# Patient Record
Sex: Female | Born: 1970 | Race: White | Hispanic: No | Marital: Single | Smoking: Never smoker
Health system: Southern US, Community
[De-identification: ages and names within clinical notes are randomized; demographics above are authoritative.]

## PROBLEM LIST (undated history)

## (undated) DIAGNOSIS — N200 Calculus of kidney: Secondary | ICD-10-CM

## (undated) DIAGNOSIS — I10 Essential (primary) hypertension: Secondary | ICD-10-CM

## (undated) DIAGNOSIS — E039 Hypothyroidism, unspecified: Secondary | ICD-10-CM

## (undated) DIAGNOSIS — E079 Disorder of thyroid, unspecified: Secondary | ICD-10-CM

## (undated) DIAGNOSIS — F99 Mental disorder, not otherwise specified: Secondary | ICD-10-CM

## (undated) DIAGNOSIS — D649 Anemia, unspecified: Secondary | ICD-10-CM

## (undated) DIAGNOSIS — E559 Vitamin D deficiency, unspecified: Secondary | ICD-10-CM

## (undated) DIAGNOSIS — Z87442 Personal history of urinary calculi: Secondary | ICD-10-CM

## (undated) DIAGNOSIS — G43909 Migraine, unspecified, not intractable, without status migrainosus: Secondary | ICD-10-CM

## (undated) DIAGNOSIS — F419 Anxiety disorder, unspecified: Secondary | ICD-10-CM

## (undated) DIAGNOSIS — M1990 Unspecified osteoarthritis, unspecified site: Secondary | ICD-10-CM

## (undated) HISTORY — DX: Migraine, unspecified, not intractable, without status migrainosus: G43.909

## (undated) HISTORY — DX: Anxiety disorder, unspecified: F41.9

## (undated) HISTORY — PX: CHOLECYSTECTOMY: SHX55

## (undated) HISTORY — DX: Calculus of kidney: N20.0

## (undated) HISTORY — DX: Vitamin D deficiency, unspecified: E55.9

## (undated) HISTORY — PX: ABDOMINAL HYSTERECTOMY: SHX81

## (undated) HISTORY — DX: Mental disorder, not otherwise specified: F99

---

## 1995-09-16 HISTORY — PX: TUBAL LIGATION: SHX77

## 2017-09-15 HISTORY — PX: LAPAROSCOPIC HYSTERECTOMY: SHX1926

## 2021-01-25 ENCOUNTER — Other Ambulatory Visit: Payer: Self-pay | Admitting: Family Medicine

## 2021-01-25 ENCOUNTER — Other Ambulatory Visit (HOSPITAL_COMMUNITY): Payer: Self-pay | Admitting: Family Medicine

## 2021-01-25 DIAGNOSIS — R1030 Lower abdominal pain, unspecified: Secondary | ICD-10-CM

## 2021-01-30 ENCOUNTER — Ambulatory Visit (HOSPITAL_COMMUNITY): Admission: RE | Admit: 2021-01-30 | Payer: Medicaid Other | Source: Ambulatory Visit

## 2021-01-30 ENCOUNTER — Other Ambulatory Visit: Payer: Self-pay

## 2021-01-30 ENCOUNTER — Encounter (HOSPITAL_COMMUNITY): Payer: Self-pay

## 2021-01-30 ENCOUNTER — Observation Stay (HOSPITAL_COMMUNITY)
Admission: EM | Admit: 2021-01-30 | Discharge: 2021-01-31 | Disposition: A | Discharge status: Home / Self Care | Payer: Medicaid Other | Attending: Family Medicine | Admitting: Family Medicine

## 2021-01-30 ENCOUNTER — Emergency Department (HOSPITAL_COMMUNITY): Payer: Medicaid Other

## 2021-01-30 DIAGNOSIS — N201 Calculus of ureter: Secondary | ICD-10-CM | POA: Diagnosis not present

## 2021-01-30 DIAGNOSIS — F32A Depression, unspecified: Secondary | ICD-10-CM | POA: Diagnosis present

## 2021-01-30 DIAGNOSIS — E785 Hyperlipidemia, unspecified: Secondary | ICD-10-CM | POA: Diagnosis present

## 2021-01-30 DIAGNOSIS — F419 Anxiety disorder, unspecified: Secondary | ICD-10-CM | POA: Diagnosis present

## 2021-01-30 DIAGNOSIS — Z87442 Personal history of urinary calculi: Secondary | ICD-10-CM | POA: Diagnosis not present

## 2021-01-30 DIAGNOSIS — Z90710 Acquired absence of both cervix and uterus: Secondary | ICD-10-CM | POA: Diagnosis not present

## 2021-01-30 DIAGNOSIS — Z9049 Acquired absence of other specified parts of digestive tract: Secondary | ICD-10-CM | POA: Diagnosis not present

## 2021-01-30 DIAGNOSIS — N200 Calculus of kidney: Secondary | ICD-10-CM | POA: Diagnosis not present

## 2021-01-30 DIAGNOSIS — Z8249 Family history of ischemic heart disease and other diseases of the circulatory system: Secondary | ICD-10-CM | POA: Diagnosis not present

## 2021-01-30 DIAGNOSIS — Z20822 Contact with and (suspected) exposure to covid-19: Secondary | ICD-10-CM | POA: Insufficient documentation

## 2021-01-30 DIAGNOSIS — Z6841 Body Mass Index (BMI) 40.0 and over, adult: Secondary | ICD-10-CM | POA: Diagnosis not present

## 2021-01-30 DIAGNOSIS — R1031 Right lower quadrant pain: Secondary | ICD-10-CM | POA: Diagnosis present

## 2021-01-30 DIAGNOSIS — N179 Acute kidney failure, unspecified: Secondary | ICD-10-CM | POA: Insufficient documentation

## 2021-01-30 DIAGNOSIS — Z79899 Other long term (current) drug therapy: Secondary | ICD-10-CM | POA: Diagnosis not present

## 2021-01-30 DIAGNOSIS — N132 Hydronephrosis with renal and ureteral calculous obstruction: Principal | ICD-10-CM | POA: Diagnosis present

## 2021-01-30 DIAGNOSIS — I10 Essential (primary) hypertension: Secondary | ICD-10-CM | POA: Insufficient documentation

## 2021-01-30 HISTORY — DX: Disorder of thyroid, unspecified: E07.9

## 2021-01-30 HISTORY — DX: Essential (primary) hypertension: I10

## 2021-01-30 LAB — URINALYSIS, ROUTINE W REFLEX MICROSCOPIC
Bilirubin Urine: NEGATIVE
Glucose, UA: NEGATIVE mg/dL
Ketones, ur: NEGATIVE mg/dL
Leukocytes,Ua: NEGATIVE
Nitrite: NEGATIVE
Protein, ur: 30 mg/dL — AB
Specific Gravity, Urine: 1.029 (ref 1.005–1.030)
pH: 5 (ref 5.0–8.0)

## 2021-01-30 LAB — COMPREHENSIVE METABOLIC PANEL
ALT: 20 U/L (ref 0–44)
AST: 17 U/L (ref 15–41)
Albumin: 4.1 g/dL (ref 3.5–5.0)
Alkaline Phosphatase: 87 U/L (ref 38–126)
Anion gap: 8 (ref 5–15)
BUN: 24 mg/dL — ABNORMAL HIGH (ref 6–20)
CO2: 27 mmol/L (ref 22–32)
Calcium: 9.4 mg/dL (ref 8.9–10.3)
Chloride: 103 mmol/L (ref 98–111)
Creatinine, Ser: 1.74 mg/dL — ABNORMAL HIGH (ref 0.44–1.00)
GFR, Estimated: 36 mL/min — ABNORMAL LOW (ref >60)
Glucose, Bld: 122 mg/dL — ABNORMAL HIGH (ref 70–99)
Potassium: 4 mmol/L (ref 3.5–5.1)
Sodium: 138 mmol/L (ref 135–145)
Total Bilirubin: 0.5 mg/dL (ref 0.3–1.2)
Total Protein: 7.3 g/dL (ref 6.5–8.1)

## 2021-01-30 LAB — RESP PANEL BY RT-PCR (FLU A&B, COVID) ARPGX2
Influenza A by PCR: NEGATIVE
Influenza B by PCR: NEGATIVE
SARS Coronavirus 2 by RT PCR: NEGATIVE

## 2021-01-30 LAB — CBC WITH DIFFERENTIAL/PLATELET
Abs Immature Granulocytes: 0.05 10*3/uL (ref 0.00–0.07)
Basophils Absolute: 0.1 10*3/uL (ref 0.0–0.1)
Basophils Relative: 1 %
Eosinophils Absolute: 0.1 10*3/uL (ref 0.0–0.5)
Eosinophils Relative: 1 %
HCT: 47.5 % — ABNORMAL HIGH (ref 36.0–46.0)
Hemoglobin: 15.3 g/dL — ABNORMAL HIGH (ref 12.0–15.0)
Immature Granulocytes: 1 %
Lymphocytes Relative: 12 %
Lymphs Abs: 1.1 10*3/uL (ref 0.7–4.0)
MCH: 32.2 pg (ref 26.0–34.0)
MCHC: 32.2 g/dL (ref 30.0–36.0)
MCV: 100 fL (ref 80.0–100.0)
Monocytes Absolute: 0.7 10*3/uL (ref 0.1–1.0)
Monocytes Relative: 7 %
Neutro Abs: 7.4 10*3/uL (ref 1.7–7.7)
Neutrophils Relative %: 78 %
Platelets: 161 10*3/uL (ref 150–400)
RBC: 4.75 MIL/uL (ref 3.87–5.11)
RDW: 13.2 % (ref 11.5–15.5)
WBC: 9.5 10*3/uL (ref 4.0–10.5)
nRBC: 0 % (ref 0.0–0.2)

## 2021-01-30 LAB — PREGNANCY, URINE: Preg Test, Ur: NEGATIVE

## 2021-01-30 LAB — LIPASE, BLOOD: Lipase: 40 U/L (ref 11–51)

## 2021-01-30 MED ORDER — AMLODIPINE BESYLATE 5 MG PO TABS
10.0000 mg | ORAL_TABLET | Freq: Every day | ORAL | Status: DC
  Administered 2021-01-31: 10 mg via ORAL
  Filled 2021-01-30: qty 2

## 2021-01-30 MED ORDER — ACETAMINOPHEN 325 MG PO TABS: 650.0000 mg | ORAL_TABLET | Freq: Four times a day (QID) | ORAL | Status: DC | PRN

## 2021-01-30 MED ORDER — ONDANSETRON HCL 4 MG PO TABS: 4.0000 mg | ORAL_TABLET | Freq: Four times a day (QID) | ORAL | Status: DC | PRN

## 2021-01-30 MED ORDER — SODIUM CHLORIDE 0.9 % IV BOLUS
1000.0000 mL | Freq: Once | INTRAVENOUS | Status: AC
  Administered 2021-01-30: 1000 mL via INTRAVENOUS

## 2021-01-30 MED ORDER — SODIUM CHLORIDE 0.9 % IV SOLN: INTRAVENOUS | Status: AC

## 2021-01-30 MED ORDER — SODIUM CHLORIDE 0.9 % IV SOLN: INTRAVENOUS | Status: DC

## 2021-01-30 MED ORDER — HYDROMORPHONE HCL 1 MG/ML IJ SOLN
1.0000 mg | Freq: Once | INTRAMUSCULAR | Status: AC
Start: 2021-01-30 — End: 2021-01-30
  Administered 2021-01-30: 1 mg via INTRAVENOUS
  Filled 2021-01-30: qty 1

## 2021-01-30 MED ORDER — ESCITALOPRAM OXALATE 10 MG PO TABS
20.0000 mg | ORAL_TABLET | Freq: Every day | ORAL | Status: DC
  Administered 2021-01-31: 20 mg via ORAL
  Filled 2021-01-30: qty 2

## 2021-01-30 MED ORDER — ONDANSETRON HCL 4 MG/2ML IJ SOLN
4.0000 mg | Freq: Once | INTRAMUSCULAR | Status: AC
  Administered 2021-01-30: 4 mg via INTRAVENOUS
  Filled 2021-01-30: qty 2

## 2021-01-30 MED ORDER — AMLODIPINE BESYLATE 5 MG PO TABS
5.0000 mg | ORAL_TABLET | Freq: Once | ORAL | Status: AC
  Administered 2021-01-30: 5 mg via ORAL
  Filled 2021-01-30: qty 1

## 2021-01-30 MED ORDER — ONDANSETRON HCL 4 MG/2ML IJ SOLN
4.0000 mg | Freq: Four times a day (QID) | INTRAMUSCULAR | Status: DC | PRN
  Administered 2021-01-31: 4 mg via INTRAVENOUS
  Filled 2021-01-30: qty 2

## 2021-01-30 MED ORDER — ACETAMINOPHEN 650 MG RE SUPP: 650.0000 mg | Freq: Four times a day (QID) | RECTAL | Status: DC | PRN

## 2021-01-30 MED ORDER — POLYETHYLENE GLYCOL 3350 17 G PO PACK: 17.0000 g | PACK | Freq: Every day | ORAL | Status: DC | PRN

## 2021-01-30 MED ORDER — MORPHINE SULFATE (PF) 4 MG/ML IV SOLN
4.0000 mg | INTRAVENOUS | Status: DC | PRN
Start: 2021-01-30 — End: 2021-02-01
  Administered 2021-01-30 – 2021-01-31 (×3): 4 mg via INTRAVENOUS
  Filled 2021-01-30 (×3): qty 1

## 2021-01-30 MED ORDER — LEVOTHYROXINE SODIUM 75 MCG PO TABS
175.0000 ug | ORAL_TABLET | Freq: Every day | ORAL | Status: DC
  Administered 2021-01-31: 175 ug via ORAL
  Filled 2021-01-30: qty 1

## 2021-01-30 MED ORDER — MORPHINE SULFATE (PF) 4 MG/ML IV SOLN
4.0000 mg | Freq: Once | INTRAVENOUS | Status: AC
Start: 2021-01-30 — End: 2021-01-30
  Administered 2021-01-30: 4 mg via INTRAVENOUS
  Filled 2021-01-30: qty 1

## 2021-01-30 MED ORDER — LOSARTAN POTASSIUM 50 MG PO TABS
100.0000 mg | ORAL_TABLET | Freq: Every day | ORAL | Status: DC
  Administered 2021-01-31: 100 mg via ORAL
  Filled 2021-01-30: qty 2

## 2021-01-30 NOTE — ED Triage Notes (Signed)
Pt presents to ED with complaints of intermittent lower abdominal pain x couple weeks. Pt states pain started in her back now is above her pubic bone, pt also with dysuria.

## 2021-01-30 NOTE — H&P (Signed)
History and Physical    Sue Schmidt TKW:[id number] DOB: 03-07-1971 DOA: 01/30/2021  PCP: Ludwig Clarks, FNP   Patient coming from: Home  I have personally briefly reviewed patient's old medical records in [place]  Chief Complaint: Back and lower abdominal pain  HPI: Sue Schmidt is a 49 y.o. female with medical history significant for hypertension, thyroid disease. Patient presented to the ED with complaints of lower abdominal pain for 2 to 3 weeks.  Pain is initially started as right back pain which progressed to lower abdominal pain.  She reports pain with urination, and reports she is unable to pass urine due to pain.  She reports 1 episode of vomiting here in the ED.  No fevers no chills.  Patient also had gross blood in her urine about 2 weeks ago. No personal history of kidney stones. Patient saw her primary care provider UA did not show signs of infection but she was placed on prophylactic antibiotics which she completed.  She does not know the name of the antibiotics.  ED Course: Blood pressure systolic 924Q to 683M, temperature 97.6.  WBC 9.5.  UA shows moderate hemoglobin rare bacteria 6-10 WBCs.  Renal stone study shows moderate right hydroureteronephrosis with 4 mm calculus at the ureterovesical junction. EDP talked with Dr. Alyson Ingles, plans for stents tomorrow.  Hospitalist to admit.  Review of Systems: As per HPI all other systems reviewed and negative.  Past Medical History:  Diagnosis Date  . Hypertension   . Thyroid disease     Past Surgical History:  Procedure Laterality Date  . ABDOMINAL HYSTERECTOMY    . CHOLECYSTECTOMY       reports that she has never smoked. She has never used smokeless tobacco. She reports previous alcohol use. She reports previous drug use. Drug: Marijuana.  No Known Allergies  Family history of hypertension in father.  Prior to Admission medications   Medication Sig Start Date End Date Taking? Authorizing Provider   amLODipine (NORVASC) 10 MG tablet Take 1 tablet by mouth daily. 01/10/21  Yes [provider]  atorvastatin (LIPITOR) 10 MG tablet Take 1 tablet by mouth daily. 01/24/21  Yes [provider]  Cholecalciferol (VITAMIN D3) 50 MCG (2000 UT) TABS Take 1 tablet by mouth daily. 10/24/20  Yes [provider]  escitalopram (LEXAPRO) 20 MG tablet Take 1 tablet by mouth daily. 10/10/20  Yes [provider]  hydrOXYzine (ATARAX/VISTARIL) 25 MG tablet Take 25 mg by mouth daily as needed. 10/10/20  Yes [provider]  levothyroxine (SYNTHROID) 175 MCG tablet Take 175 mcg by mouth daily. 01/11/21  Yes [provider]  losartan (COZAAR) 100 MG tablet Take 1 tablet by mouth daily. 01/10/21  Yes [provider]  SUMAtriptan (IMITREX) 100 MG tablet Take 100 mg by mouth as needed for headache. 10/10/20  Yes [provider]    Physical Exam: Vitals:   01/30/21 1405 01/30/21 1430 01/30/21 1500 01/30/21 1530  BP:  (!) 177/115 (!) 177/93 (!) 164/108  Pulse: 71 70    Resp: 16 (!) 23 (!) 24 17  Temp:      TempSrc:      SpO2: 97% 94% 95% 90%  Weight:      Height:        Constitutional: NAD, calm, comfortable Vitals:   01/30/21 1405 01/30/21 1430 01/30/21 1500 01/30/21 1530  BP:  (!) 177/115 (!) 177/93 (!) 164/108  Pulse: 71 70    Resp: 16 (!) 23 (!) 24 17  Temp:      TempSrc:      SpO2: 97% 94% 95% 90%  Weight:      Height:       Eyes: PERRL, lids and conjunctivae normal ENMT: Mucous membranes are moist. Posterior pharynx clear of any exudate or lesions.Normal dentition.  Neck: normal, supple, no masses, no thyromegaly Respiratory: clear to auscultation bilaterally, no wheezing, no crackles. Normal respiratory effort. No accessory muscle use.  Cardiovascular: Regular rate and rhythm, no murmurs / rubs / gallops. No extremity edema. 2+ pedal pulses. No carotid bruits.  Abdomen: Moderate right lower abdominal tenderness, abdomen soft, no  masses palpated. No hepatosplenomegaly. Bowel sounds positive.  Musculoskeletal: no clubbing / cyanosis. No joint deformity upper and lower extremities. Good ROM, no contractures. Normal muscle tone.  Skin: no rashes, lesions, ulcers. No induration Neurologic: No apparent cranial normality, moving extremities spontaneously.  Psychiatric: Normal judgment and insight. Alert and oriented x 3. Normal mood.   Labs on Admission: I have personally reviewed following labs and imaging studies  CBC: Recent Labs  Lab 01/30/21 1151  WBC 9.5  NEUTROABS 7.4  HGB 15.3*  HCT 47.5*  MCV 100.0  PLT 993   Basic Metabolic Panel: Recent Labs  Lab 01/30/21 1151  NA 138  K 4.0  CL 103  CO2 27  GLUCOSE 122*  BUN 24*  CREATININE 1.74*  CALCIUM 9.4   Liver Function Tests: Recent Labs  Lab 01/30/21 1151  AST 17  ALT 20  ALKPHOS 87  BILITOT 0.5  PROT 7.3  ALBUMIN 4.1   Recent Labs  Lab 01/30/21 1151  LIPASE 40   Urine analysis:    Component Value Date/Time   COLORURINE YELLOW 01/30/2021 1031   APPEARANCEUR TURBID (A) 01/30/2021 1031   LABSPEC 1.029 01/30/2021 1031   PHURINE 5.0 01/30/2021 1031   GLUCOSEU NEGATIVE 01/30/2021 1031   HGBUR MODERATE (A) 01/30/2021 [place] 01/30/2021 [place] 01/30/2021 1031   PROTEINUR 30 (A) 01/30/2021 1031   NITRITE NEGATIVE 01/30/2021 1031   [place] 01/30/2021 1031    Radiological Exams on Admission: CT Renal Stone Study  Result Date: 01/30/2021 CLINICAL DATA:  Acute right flank pain, dysuria. EXAM: CT ABDOMEN AND PELVIS WITHOUT CONTRAST TECHNIQUE: Multidetector CT imaging of the abdomen and pelvis was performed following the standard protocol without IV contrast. COMPARISON:  None. FINDINGS: Lower chest: No acute abnormality. Hepatobiliary: No focal liver abnormality is seen. Status post cholecystectomy. No biliary dilatation. Pancreas: Unremarkable. No pancreatic ductal dilatation or  surrounding inflammatory changes. Spleen: Normal in size without focal abnormality. Adrenals/Urinary Tract: Adrenal glands appear normal. Moderate right hydroureteronephrosis is noted with perinephric stranding secondary to 4 mm calculus at right ureterovesical junction. Left kidney and ureter are unremarkable. Urinary bladder is decompressed. Stomach/Bowel: Stomach is within normal limits. Appendix appears normal. No evidence of bowel wall thickening, distention, or inflammatory changes. Vascular/Lymphatic: No significant vascular findings are present. No enlarged abdominal or pelvic lymph nodes. Reproductive: Status post hysterectomy. No adnexal masses. Other: No abdominal wall hernia or abnormality. No abdominopelvic ascites. Musculoskeletal: No acute or significant osseous findings. IMPRESSION: Moderate right hydroureteronephrosis is noted with perinephric stranding secondary to 4 mm calculus at right ureterovesical junction. Electronically Signed   By: Marijo Conception M.D.   On: 01/30/2021 12:50    EKG: None.  Assessment/Plan Principal Problem:   Renal stone Active Problems:   AKI (acute kidney injury) ([place])   HTN (hypertension)   Dyslipidemia  Nephrolithiasis-stone study  shows moderate right hydroureteronephrosis with 4 mm calculus and ureteral vesicle junction.  UA with large hemoglobin, rare bacteria 6-10 WBCs.  Afebrile without leukocytosis. Recently completed a course of antibiotics- ?name. - EDP talked to urologist Dr. Alyson Ingles, plan for stent in a.m. -IV morphine 4 mg every 3 hours as needed for pain -2 L bolus given, continue N/s 100cc/hr x 15 hrs -N.p.o. midnight -Hold off on antibiotics for now -Follow-up urine cultures  AKI_creatinine 1.7, no baseline to compare -Hydrate  HTN-elevated. -Resume Norvasc, losartan.  Depression/Anxiety -Resume Lexapro  DVT prophylaxis: SCDS for now Code Status: Full code Family Communication: None at bedside Disposition Plan: ~ 1 -2  days Consults called: Urology Admission status: Inpt, tele I certify that at the point of admission it is my clinical judgment that the patient will require inpatient hospital care spanning beyond 2 midnights from the point of admission due to high intensity of service, high risk for further deterioration and high frequency of surveillance required.     Bethena Roys MD [facility]  01/30/2021, 8:06 PM

## 2021-01-30 NOTE — ED Provider Notes (Signed)
[facility] Provider Note   CSN: [id number] Arrival date & time: 01/30/21  1016     History Chief Complaint  Patient presents with  . Abdominal Pain    Sue Schmidt is a 50 y.o. female with  past medical history of hypertension that presents to the emergency department today for complaints of intermittent lower abdominal pain for the past couple of weeks.  Patient states that pain started in her right lower back and is now primarily in her right suprapubic region, has been worsening over the past couple of days.  Patient states that she had gross blood in her urine about 2 weeks ago, went to her PCP who did a urinalysis at that time she is not so signs of infection according to patient, however provider put her on prophylactic antibiotics which she completed.  Patient does not know the name of these.  Patient states that over the past night the pain worsened and is now primarily in the right suprapubic region, does not radiate anywhere, is sharp and intermittent.  States that she has not been taking anything for this, states that she has been hydrating well.  Also admits to dysuria, urinary frequency, however states that she is unable to urinate due to the pain.  Denies any back pain currently, fevers, myalgias, headache, URI symptoms, sick contacts.  States that she is having nausea and vomiting, vomiting up bile.  Denies any diarrhea, normal bowel movements.  States that she has not had an appetite.  Denies any history of kidney stones or kidney disease.  Denies any vaginal complaints.  Denies any chest pain or shortness of breath.  HPI     Past Medical History:  Diagnosis Date  . Hypertension   . Thyroid disease     There are no problems to display for this patient.   Past Surgical History:  Procedure Laterality Date  . ABDOMINAL HYSTERECTOMY    . CHOLECYSTECTOMY       OB History   No obstetric history on file.     No family history on file.  Social  History   Tobacco Use  . Smoking status: Never Smoker  . Smokeless tobacco: Never Used  Substance Use Topics  . Alcohol use: Not Currently  . Drug use: Not Currently    Types: Marijuana    Home Medications Prior to Admission medications   Medication Sig Start Date End Date Taking? Authorizing Provider  amLODipine (NORVASC) 10 MG tablet Take 1 tablet by mouth daily. 01/10/21  Yes [provider]  atorvastatin (LIPITOR) 10 MG tablet Take 1 tablet by mouth daily. 01/24/21  Yes [provider]  Cholecalciferol (VITAMIN D3) 50 MCG (2000 UT) TABS Take 1 tablet by mouth daily. 10/24/20  Yes [provider]  escitalopram (LEXAPRO) 20 MG tablet Take 1 tablet by mouth daily. 10/10/20  Yes [provider]  hydrOXYzine (ATARAX/VISTARIL) 25 MG tablet Take 25 mg by mouth daily as needed. 10/10/20  Yes [provider]  levothyroxine (SYNTHROID) 175 MCG tablet Take 175 mcg by mouth daily. 01/11/21  Yes [provider]  losartan (COZAAR) 100 MG tablet Take 1 tablet by mouth daily. 01/10/21  Yes [provider]  SUMAtriptan (IMITREX) 100 MG tablet Take 100 mg by mouth as needed for headache. 10/10/20  Yes [provider]    Allergies    Patient has no known allergies.  Review of Systems   Review of Systems  Constitutional: Negative for chills, diaphoresis, fatigue and  fever.  HENT: Negative for congestion, sore throat and trouble swallowing.   Eyes: Negative for pain and visual disturbance.  Respiratory: Negative for cough, shortness of breath and wheezing.   Cardiovascular: Negative for chest pain, palpitations and leg swelling.  Gastrointestinal: Positive for abdominal pain. Negative for abdominal distention, diarrhea, nausea and vomiting.  Genitourinary: Positive for decreased urine volume, dysuria, frequency, hematuria and urgency. Negative for difficulty urinating, vaginal bleeding, vaginal discharge and vaginal pain.   Musculoskeletal: Negative for back pain, neck pain and neck stiffness.  Skin: Negative for pallor.  Neurological: Negative for dizziness, speech difficulty, weakness and headaches.  Psychiatric/Behavioral: Negative for confusion.    Physical Exam Updated Vital Signs BP (!) 164/108   Pulse 70   Temp 97.6 F (36.4 C) (Oral)   Resp 17   Ht 5\' 5"  (1.651 m)   Wt 120.2 kg   SpO2 90%   BMI 44.10 kg/m   Physical Exam Constitutional:      General: She is not in acute distress.    Appearance: Normal appearance. She is not ill-appearing, toxic-appearing or diaphoretic.  HENT:     Mouth/Throat:     Mouth: Mucous membranes are moist.     Pharynx: Oropharynx is clear.  Eyes:     General: No scleral icterus.    Extraocular Movements: Extraocular movements intact.     Pupils: Pupils are equal, round, and reactive to light.  Cardiovascular:     Rate and Rhythm: Normal rate and regular rhythm.     Pulses: Normal pulses.     Heart sounds: Normal heart sounds.  Pulmonary:     Effort: Pulmonary effort is normal. No respiratory distress.     Breath sounds: Normal breath sounds. No stridor. No wheezing, rhonchi or rales.  Chest:     Chest wall: No tenderness.  Abdominal:     General: Abdomen is flat. There is no distension.     Palpations: Abdomen is soft.     Tenderness: There is abdominal tenderness in the suprapubic area. There is guarding. There is no right CVA tenderness, left CVA tenderness or rebound. Negative signs include Murphy's sign, psoas sign and obturator sign.       Comments: Patient with tenderness to suprapubic region and right side.  No true McBurney's point.  Guarding noted in this area.  No CVA tenderness.  Musculoskeletal:        General: No swelling or tenderness. Normal range of motion.     Cervical back: Normal range of motion and neck supple. No rigidity.     Right lower leg: No edema.     Left lower leg: No edema.  Skin:    General: Skin is warm and dry.      Capillary Refill: Capillary refill takes less than 2 seconds.     Coloration: Skin is not pale.  Neurological:     General: No focal deficit present.     Mental Status: She is alert and oriented to person, place, and time.  Psychiatric:        Mood and Affect: Mood normal.        Behavior: Behavior normal.     ED Results / Procedures / Treatments   Labs (all labs ordered are listed, but only abnormal results are displayed) Labs Reviewed  CBC WITH DIFFERENTIAL/PLATELET - Abnormal; Notable for the following components:      Result Value   Hemoglobin 15.3 (*)    HCT 47.5 (*)    All other components  within normal limits  COMPREHENSIVE METABOLIC PANEL - Abnormal; Notable for the following components:   Glucose, Bld 122 (*)    BUN 24 (*)    Creatinine, Ser 1.74 (*)    GFR, Estimated 36 (*)    All other components within normal limits  URINALYSIS, ROUTINE W REFLEX MICROSCOPIC - Abnormal; Notable for the following components:   APPearance TURBID (*)    Hgb urine dipstick MODERATE (*)    Protein, ur 30 (*)    Bacteria, UA RARE (*)    All other components within normal limits  RESP PANEL BY RT-PCR (FLU A&B, COVID) ARPGX2  LIPASE, BLOOD  PREGNANCY, URINE    EKG None  Radiology CT Renal Stone Study  Result Date: 01/30/2021 CLINICAL DATA:  Acute right flank pain, dysuria. EXAM: CT ABDOMEN AND PELVIS WITHOUT CONTRAST TECHNIQUE: Multidetector CT imaging of the abdomen and pelvis was performed following the standard protocol without IV contrast. COMPARISON:  None. FINDINGS: Lower chest: No acute abnormality. Hepatobiliary: No focal liver abnormality is seen. Status post cholecystectomy. No biliary dilatation. Pancreas: Unremarkable. No pancreatic ductal dilatation or surrounding inflammatory changes. Spleen: Normal in size without focal abnormality. Adrenals/Urinary Tract: Adrenal glands appear normal. Moderate right hydroureteronephrosis is noted with perinephric stranding secondary to 4  mm calculus at right ureterovesical junction. Left kidney and ureter are unremarkable. Urinary bladder is decompressed. Stomach/Bowel: Stomach is within normal limits. Appendix appears normal. No evidence of bowel wall thickening, distention, or inflammatory changes. Vascular/Lymphatic: No significant vascular findings are present. No enlarged abdominal or pelvic lymph nodes. Reproductive: Status post hysterectomy. No adnexal masses. Other: No abdominal wall hernia or abnormality. No abdominopelvic ascites. Musculoskeletal: No acute or significant osseous findings. IMPRESSION: Moderate right hydroureteronephrosis is noted with perinephric stranding secondary to 4 mm calculus at right ureterovesical junction. Electronically Signed   By: Marijo Conception M.D.   On: 01/30/2021 12:50    Procedures Procedures   Medications Ordered in ED Medications  sodium chloride 0.9 % bolus 1,000 mL (0 mLs Intravenous Stopped 01/30/21 1209)  ondansetron (ZOFRAN) injection 4 mg (4 mg Intravenous Given 01/30/21 1123)  HYDROmorphone (DILAUDID) injection 1 mg (1 mg Intravenous Given 01/30/21 1124)  amLODipine (NORVASC) tablet 5 mg (5 mg Oral Given 01/30/21 1209)  ondansetron (ZOFRAN) injection 4 mg (4 mg Intravenous Given 01/30/21 1405)  sodium chloride 0.9 % bolus 1,000 mL (1,000 mLs Intravenous New Bag/Given 01/30/21 1405)  morphine 4 MG/ML injection 4 mg (4 mg Intravenous Given 01/30/21 1406)    ED Course  I have reviewed the triage vital signs and the nursing notes.  Pertinent labs & imaging results that were available during my care of the patient were reviewed by me and considered in my medical decision making (see chart for details).    MDM Rules/Calculators/A&P                           Patient presents to the emerge department today for right suprapubic pain in addition to urinary complaints, differential to include renal colic, urethral stone.  Patient is significantly hypertensive with systolic 629, patient  states that she forgot to take her blood pressure medication this morning.  We will give that here, do think that this is also increased due to pain.  We will treat with IV fluids, Zofran and Dilaudid at this time, CT renal study ordered.  Low likelihood for abdominal dissection or AAA patient does not have any back pain at this time,  pain is intermittent and not sharp.  Pain is not midline and symptoms are most suggestive of renal colic.   Patient with 4 mm stone with hydroureteronephrosis, presumed UTI.  Unable to obtain other records from patient, patient admitted to [facility].  Creatinine here today 1.7 2:04 rounds of pain medication and IV fluids, patient states that her pain is still not under control, will admit at this time.  [facility], Dr. Alyson Ingles who will place stent tomorrow. Patient admitted to Dr. Denton Brick.  The patient appears reasonably stabilized for admission considering the current resources, flow, and capabilities available in the ED at this time, and I doubt any other [facility] requiring further screening and/or treatment in the ED prior to admission.   Final Clinical Impression(s) / ED Diagnoses Final diagnoses:  Nephrolithiasis    Rx / DC Orders ED Discharge Orders    None       Alfredia Client, PA-C 01/30/21 1632    Truddie Hidden, MD 01/30/21 [phone]

## 2021-01-31 ENCOUNTER — Inpatient Hospital Stay (HOSPITAL_COMMUNITY): Payer: Medicaid Other | Admitting: Certified Registered"

## 2021-01-31 ENCOUNTER — Inpatient Hospital Stay (HOSPITAL_COMMUNITY): Payer: Medicaid Other

## 2021-01-31 ENCOUNTER — Other Ambulatory Visit: Payer: Self-pay

## 2021-01-31 ENCOUNTER — Encounter (HOSPITAL_COMMUNITY): Payer: Self-pay | Admitting: Internal Medicine

## 2021-01-31 ENCOUNTER — Encounter (HOSPITAL_COMMUNITY)
Admission: EM | Disposition: A | Discharge status: Home / Self Care | Payer: Self-pay | Source: Home / Self Care | Attending: Emergency Medicine

## 2021-01-31 DIAGNOSIS — N200 Calculus of kidney: Secondary | ICD-10-CM | POA: Diagnosis not present

## 2021-01-31 DIAGNOSIS — N201 Calculus of ureter: Secondary | ICD-10-CM | POA: Diagnosis not present

## 2021-01-31 DIAGNOSIS — Z79899 Other long term (current) drug therapy: Secondary | ICD-10-CM | POA: Diagnosis not present

## 2021-01-31 DIAGNOSIS — I10 Essential (primary) hypertension: Secondary | ICD-10-CM | POA: Diagnosis not present

## 2021-01-31 DIAGNOSIS — Z20822 Contact with and (suspected) exposure to covid-19: Secondary | ICD-10-CM | POA: Diagnosis not present

## 2021-01-31 DIAGNOSIS — N1330 Unspecified hydronephrosis: Secondary | ICD-10-CM | POA: Diagnosis not present

## 2021-01-31 HISTORY — PX: STONE EXTRACTION WITH BASKET: SHX5318

## 2021-01-31 HISTORY — PX: CYSTOSCOPY WITH RETROGRADE PYELOGRAM, URETEROSCOPY AND STENT PLACEMENT: SHX5789

## 2021-01-31 LAB — CBC
HCT: 44 % (ref 36.0–46.0)
Hemoglobin: 13.9 g/dL (ref 12.0–15.0)
MCH: 32.3 pg (ref 26.0–34.0)
MCHC: 31.6 g/dL (ref 30.0–36.0)
MCV: 102.1 fL — ABNORMAL HIGH (ref 80.0–100.0)
Platelets: 142 10*3/uL — ABNORMAL LOW (ref 150–400)
RBC: 4.31 MIL/uL (ref 3.87–5.11)
RDW: 13.2 % (ref 11.5–15.5)
WBC: 8.2 10*3/uL (ref 4.0–10.5)
nRBC: 0 % (ref 0.0–0.2)

## 2021-01-31 LAB — BASIC METABOLIC PANEL
Anion gap: 7 (ref 5–15)
BUN: 17 mg/dL (ref 6–20)
CO2: 24 mmol/L (ref 22–32)
Calcium: 8 mg/dL — ABNORMAL LOW (ref 8.9–10.3)
Chloride: 106 mmol/L (ref 98–111)
Creatinine, Ser: 1.53 mg/dL — ABNORMAL HIGH (ref 0.44–1.00)
GFR, Estimated: 41 mL/min — ABNORMAL LOW (ref >60)
Glucose, Bld: 130 mg/dL — ABNORMAL HIGH (ref 70–99)
Potassium: 3.7 mmol/L (ref 3.5–5.1)
Sodium: 137 mmol/L (ref 135–145)

## 2021-01-31 LAB — HIV ANTIBODY (ROUTINE TESTING W REFLEX): HIV Screen 4th Generation wRfx: NONREACTIVE

## 2021-01-31 SURGERY — CYSTOURETEROSCOPY, WITH RETROGRADE PYELOGRAM AND STENT INSERTION
Anesthesia: General | Laterality: Right

## 2021-01-31 MED ORDER — CHLORHEXIDINE GLUCONATE 0.12 % MT SOLN
OROMUCOSAL | Status: AC
  Filled 2021-01-31: qty 15

## 2021-01-31 MED ORDER — FENTANYL CITRATE (PF) 100 MCG/2ML IJ SOLN
INTRAMUSCULAR | Status: AC
  Filled 2021-01-31: qty 2

## 2021-01-31 MED ORDER — LIDOCAINE HCL (CARDIAC) PF 100 MG/5ML IV SOSY
PREFILLED_SYRINGE | INTRAVENOUS | Status: DC | PRN
  Administered 2021-01-31: 60 mg via INTRAVENOUS

## 2021-01-31 MED ORDER — PHENYLEPHRINE HCL (PRESSORS) 10 MG/ML IV SOLN
INTRAVENOUS | Status: DC | PRN
  Administered 2021-01-31: 40 ug via INTRAVENOUS
  Administered 2021-01-31 (×3): 120 ug via INTRAVENOUS

## 2021-01-31 MED ORDER — ONDANSETRON HCL 4 MG PO TABS: 4.0000 mg | ORAL_TABLET | Freq: Every day | ORAL | 1 refills | Status: AC | PRN

## 2021-01-31 MED ORDER — LACTATED RINGERS IV SOLN: INTRAVENOUS | Status: DC

## 2021-01-31 MED ORDER — SODIUM CHLORIDE 0.9 % IR SOLN
Status: DC | PRN
  Administered 2021-01-31: 3000 mL via INTRAVESICAL

## 2021-01-31 MED ORDER — WATER FOR IRRIGATION, STERILE IR SOLN
Status: DC | PRN
  Administered 2021-01-31: 500 mL

## 2021-01-31 MED ORDER — ONDANSETRON HCL 4 MG/2ML IJ SOLN: 4.0000 mg | Freq: Once | INTRAMUSCULAR | Status: DC | PRN

## 2021-01-31 MED ORDER — FENTANYL CITRATE (PF) 100 MCG/2ML IJ SOLN: 25.0000 ug | INTRAMUSCULAR | Status: DC | PRN

## 2021-01-31 MED ORDER — ORAL CARE MOUTH RINSE: 15.0000 mL | Freq: Once | OROMUCOSAL | Status: AC

## 2021-01-31 MED ORDER — HYDRALAZINE HCL 100 MG PO TABS: 50.0000 mg | ORAL_TABLET | Freq: Two times a day (BID) | ORAL | 3 refills | Status: DC

## 2021-01-31 MED ORDER — PROPOFOL 10 MG/ML IV BOLUS
INTRAVENOUS | Status: DC | PRN
  Administered 2021-01-31: 250 mg via INTRAVENOUS

## 2021-01-31 MED ORDER — CEFAZOLIN SODIUM-DEXTROSE 2-4 GM/100ML-% IV SOLN
2.0000 g | Freq: Once | INTRAVENOUS | Status: AC
  Administered 2021-01-31: 2 g via INTRAVENOUS
  Filled 2021-01-31: qty 100

## 2021-01-31 MED ORDER — DIATRIZOATE MEGLUMINE 30 % UR SOLN
URETHRAL | Status: DC | PRN
  Administered 2021-01-31: 6 mL

## 2021-01-31 MED ORDER — OXYCODONE-ACETAMINOPHEN 5-325 MG PO TABS: 1.0000 | ORAL_TABLET | ORAL | 0 refills | Status: AC | PRN

## 2021-01-31 MED ORDER — FENTANYL CITRATE (PF) 250 MCG/5ML IJ SOLN
INTRAMUSCULAR | Status: DC | PRN
  Administered 2021-01-31 (×2): 25 ug via INTRAVENOUS

## 2021-01-31 MED ORDER — CHLORHEXIDINE GLUCONATE 0.12 % MT SOLN
15.0000 mL | Freq: Once | OROMUCOSAL | Status: AC
  Administered 2021-01-31: 15 mL via OROMUCOSAL

## 2021-01-31 MED ORDER — LABETALOL HCL 5 MG/ML IV SOLN
10.0000 mg | INTRAVENOUS | Status: DC | PRN
  Administered 2021-01-31: 10 mg via INTRAVENOUS
  Filled 2021-01-31: qty 4

## 2021-01-31 MED ORDER — DIATRIZOATE MEGLUMINE 30 % UR SOLN
URETHRAL | Status: AC
  Filled 2021-01-31: qty 100

## 2021-01-31 MED ORDER — MIDAZOLAM HCL 2 MG/2ML IJ SOLN
INTRAMUSCULAR | Status: AC
  Filled 2021-01-31: qty 2

## 2021-01-31 MED ORDER — ONDANSETRON HCL 4 MG/2ML IJ SOLN
INTRAMUSCULAR | Status: DC | PRN
  Administered 2021-01-31: 4 mg via INTRAVENOUS

## 2021-01-31 MED ORDER — MIDAZOLAM HCL 2 MG/2ML IJ SOLN
INTRAMUSCULAR | Status: DC | PRN
  Administered 2021-01-31: 2 mg via INTRAVENOUS

## 2021-01-31 MED ORDER — DEXAMETHASONE SODIUM PHOSPHATE 10 MG/ML IJ SOLN
INTRAMUSCULAR | Status: DC | PRN
  Administered 2021-01-31: 5 mg via INTRAVENOUS

## 2021-01-31 SURGICAL SUPPLY — 24 items
BAG DRAIN URO TABLE W/ADPT NS (BAG) ×2 IMPLANT
BAG HAMPER (MISCELLANEOUS) ×2 IMPLANT
CATH INTERMIT  6FR 70CM (CATHETERS) ×2 IMPLANT
CLOTH BEACON ORANGE TIMEOUT ST (SAFETY) ×2 IMPLANT
DECANTER SPIKE VIAL GLASS SM (MISCELLANEOUS) ×2 IMPLANT
EXTRACTOR STONE NITINOL NGAGE (UROLOGICAL SUPPLIES) ×2 IMPLANT
GLOVE BIO SURGEON STRL SZ8 (GLOVE) ×2 IMPLANT
GLOVE SURG LTX SZ6.5 (GLOVE) ×2 IMPLANT
GLOVE SURG UNDER POLY LF SZ7 (GLOVE) ×4 IMPLANT
GOWN STRL REUS W/TWL LRG LVL3 (GOWN DISPOSABLE) ×2 IMPLANT
GOWN STRL REUS W/TWL XL LVL3 (GOWN DISPOSABLE) ×2 IMPLANT
GUIDEWIRE STR DUAL SENSOR (WIRE) ×2 IMPLANT
GUIDEWIRE STR ZIPWIRE 035X150 (MISCELLANEOUS) ×2 IMPLANT
IV NS IRRIG 3000ML ARTHROMATIC (IV SOLUTION) ×4 IMPLANT
KIT TURNOVER CYSTO (KITS) ×2 IMPLANT
MANIFOLD NEPTUNE II (INSTRUMENTS) ×2 IMPLANT
PACK CYSTO (CUSTOM PROCEDURE TRAY) ×2 IMPLANT
PAD ARMBOARD 7.5X6 YLW CONV (MISCELLANEOUS) ×2 IMPLANT
STENT URET 6FRX26 CONTOUR (STENTS) ×2 IMPLANT
SYR 10ML LL (SYRINGE) ×2 IMPLANT
SYR CONTROL 10ML LL (SYRINGE) ×2 IMPLANT
TOWEL NATURAL 4PK STERILE (DISPOSABLE) ×2 IMPLANT
TOWEL OR 17X26 4PK STRL BLUE (TOWEL DISPOSABLE) ×2 IMPLANT
WATER STERILE IRR 500ML POUR (IV SOLUTION) ×2 IMPLANT

## 2021-01-31 NOTE — Op Note (Signed)
Preoperative diagnosis: Right ureteral stone  Postoperative diagnosis: Same  Procedure: 1 cystoscopy 2 right retrograde pyelography 3.  Intraoperative fluoroscopy, under one hour, with interpretation 4.  Right ureteroscopic stone manipulation with basket extraction 5.  Right 6 x 26 JJ stent placement  Attending: Rosie Fate  Anesthesia: General  Estimated blood loss: None  Drains: Right 6 x 26 JJ ureteral stent with tether  Specimens: stone for analysis  Antibiotics: ancef  Findings: right distal ureteral calculus. Moderate hydronephrosis.  Indications: Patient is a 50 year old female with a history of ureteral stone and who has failed medical expulsive therapy.  After discussing treatment options, she decided proceed with right ureteroscopic stone manipulation.  Procedure her in detail: The patient was brought to the operating room and a brief timeout was done to ensure correct patient, correct procedure, correct site.  General anesthesia was administered patient was placed in dorsal lithotomy position.  Her genitalia was then prepped and draped in usual sterile fashion.  A rigid 62 French cystoscope was passed in the urethra and the bladder.  Bladder was inspected free masses or lesions.  the right ureteral orifices were in the normal orthotopic locations.  a 6 french ureteral catheter was then instilled into the right ureter orifice.  a gentle retrograde was obtained and findings noted above.  we then placed a zip wire through the ureteral catheter and advanced up to the renal pelvis.  we then removed the cystoscope and cannulated the right ureteral orifice with a semirigid ureteroscope.  we then encountered the stone in the distal ureter which was removed with an NGage basket.  We then elected to place a ureteral stent after the stone was removed. We placed a 6 x 26 double-j ureteral stent over the original zip wire.   We then removed the wire and good coil was noted in the the  renal pelvis under fluoroscopy and the bladder under direct vision.     the bladder was then drained and this concluded the procedure which was well tolerated by patient.  Complications: None  Condition: Stable, extubated, transferred to PACU  Plan: Patient is to be discharged home as to follow-up in one week. She is to remove her stent in 72 hours by pulling the tether

## 2021-01-31 NOTE — Anesthesia Procedure Notes (Signed)
Procedure Name: LMA Insertion Date/Time: 01/31/2021 10:23 AM Performed by: Karna Dupes, CRNA Pre-anesthesia Checklist: Patient identified, Emergency Drugs available, Suction available and Patient being monitored Patient Re-evaluated:Patient Re-evaluated prior to induction Oxygen Delivery Method: Circle system utilized Preoxygenation: Pre-oxygenation with 100% oxygen Induction Type: IV induction LMA: LMA inserted LMA Size: 3.0 Number of attempts: 2 (LMA 4 tried small mouth opening. ) Tube secured with: Tape Dental Injury: Teeth and Oropharynx as per pre-operative assessment

## 2021-01-31 NOTE — Discharge Instructions (Signed)
Ureteral Stent Implantation, Care After This sheet gives you information about how to care for yourself after your procedure. Your health care provider may also give you more specific instructions. If you have problems or questions, contact your health care provider. What can I expect after the procedure? After the procedure, it is common to have:  Nausea.  Mild pain when you urinate. You may feel this pain in your lower back or lower abdomen. The pain should stop within a few minutes after you urinate. This may last for up to 1 week.  A small amount of blood in your urine for several days. Follow these instructions at home: Medicines  Take over-the-counter and prescription medicines only as told by your health care provider.  If you were prescribed an antibiotic medicine, take it as told by your health care provider. Do not stop taking the antibiotic even if you start to feel better.  Do not drive for 24 hours if you were given a sedative during your procedure.  Ask your health care provider if the medicine prescribed to you requires you to avoid driving or using heavy machinery. Activity  Rest as told by your health care provider.  Avoid sitting for a long time without moving. Get up to take short walks every 1-2 hours. This is important to improve blood flow and breathing. Ask for help if you feel weak or unsteady.  Return to your normal activities as told by your health care provider. Ask your health care provider what activities are safe for you. General instructions  Watch for any blood in your urine. Call your health care provider if the amount of blood in your urine increases.  If you have a catheter: ? Follow instructions from your health care provider about taking care of your catheter and collection bag. ? Do not take baths, swim, or use a hot tub until your health care provider approves. Ask your health care provider if you may take showers. You may only be allowed to  take sponge baths.  Drink enough fluid to keep your urine pale yellow.  Do not use any products that contain nicotine or tobacco, such as cigarettes, e-cigarettes, and chewing tobacco. These can delay healing after surgery. If you need help quitting, ask your health care provider.  Keep all follow-up visits as told by your health care provider. This is important.   Contact a health care provider if:  You have pain that gets worse or does not get better with medicine, especially pain when you urinate.  You have difficulty urinating.  You feel nauseous or you vomit repeatedly during a period of more than 2 days after the procedure. Get help right away if:  Your urine is dark red or has blood clots in it.  You are leaking urine (have incontinence).  The end of the stent comes out of your urethra.  You cannot urinate.  You have sudden, sharp, or severe pain in your abdomen or lower back.  You have a fever.  You have swelling or pain in your legs.  You have difficulty breathing. Summary  After the procedure, it is common to have mild pain when you urinate that goes away within a few minutes after you urinate. This may last for up to 1 week.  Watch for any blood in your urine. Call your health care provider if the amount of blood in your urine increases.  Take over-the-counter and prescription medicines only as told by your health care provider.  Drink enough fluid to keep your urine pale yellow. This information is not intended to replace advice given to you by your health care provider. Make sure you discuss any questions you have with your health care provider. Document Revised: 06/08/2018 Document Reviewed: 06/09/2018 Elsevier Patient Education  2021 [place] REMOVE YOUR STENT IN 72 HOURS BY GENTLY PULLING THE STRING-remove her stent in 72 hours by pulling the tether

## 2021-01-31 NOTE — Anesthesia Preprocedure Evaluation (Signed)
Anesthesia Evaluation  Patient identified by MRN, date of birth, ID band Patient awake    Reviewed: Allergy & Precautions, H&P , NPO status , Patient's Chart, lab work & pertinent test results, reviewed documented beta blocker date and time   Airway Mallampati: II  TM Distance: >3 FB Neck ROM: full    Dental no notable dental hx.    Pulmonary neg pulmonary ROS,    Pulmonary exam normal breath sounds clear to auscultation       Cardiovascular Exercise Tolerance: Good hypertension, negative cardio ROS   Rhythm:regular Rate:Normal     Neuro/Psych negative neurological ROS  negative psych ROS   GI/Hepatic negative GI ROS, Neg liver ROS,   Endo/Other  negative endocrine ROS  Renal/GU ARFRenal disease  negative genitourinary   Musculoskeletal   Abdominal   Peds  Hematology negative hematology ROS (+)   Anesthesia Other Findings   Reproductive/Obstetrics negative OB ROS                             Anesthesia Physical Anesthesia Plan  ASA: II  Anesthesia Plan: General and General LMA   Post-op Pain Management:    Induction:   PONV Risk Score and Plan: Ondansetron  Airway Management Planned:   Additional Equipment:   Intra-op Plan:   Post-operative Plan:   Informed Consent: I have reviewed the patients History and Physical, chart, labs and discussed the procedure including the risks, benefits and alternatives for the proposed anesthesia with the patient or authorized representative who has indicated his/her understanding and acceptance.     Dental Advisory Given  Plan Discussed with: CRNA  Anesthesia Plan Comments:         Anesthesia Quick Evaluation

## 2021-01-31 NOTE — Transfer of Care (Signed)
Immediate Anesthesia Transfer of Care Note  Patient: Occupational hygienist  Procedure(s) Performed: CYSTOSCOPY WITH RIGHT RETROGRADE PYELOGRAM, RIGHT URETEROSCOPY AND RIGHT URETERAL STENT PLACEMENT (Right ) STONE EXTRACTION WITH BASKET  Patient Location: PACU  Anesthesia Type:General  Level of Consciousness: drowsy  Airway & Oxygen Therapy: Patient Spontanous Breathing and Patient connected to face mask oxygen  Post-op Assessment: Report given to RN and Post -op Vital signs reviewed and stable  Post vital signs: Reviewed and stable  Last Vitals:  Vitals Value Taken Time  BP 137/77   Temp    Pulse 84 01/31/21 1052  Resp    SpO2 96 % 01/31/21 1052  Vitals shown include unvalidated device data.  Last Pain:  Vitals:   01/31/21 1008  TempSrc: Oral  PainSc: 7       Patients Stated Pain Goal: 5 (85/88/50 2774)  Complications: No complications documented.

## 2021-01-31 NOTE — Discharge Summary (Signed)
Sue Schmidt, is a 50 y.o. female  DOB 01-27-1971  MRN [id number].  Admission date:  01/30/2021  Admitting Physician  Bethena Roys, MD  Discharge Date:  01/31/2021   Primary MD  Ludwig Clarks, FNP  Recommendations for primary care physician for things to follow:   1)PLEASE REMOVE YOUR STENT IN 72 HOURS BY GENTLY PULLING THE STRING-remove her stent in 72 hours by pulling the tether -Follow-up with urologist Dr. Alyson Ingles in a week for recheck  2)Avoid ibuprofen/Advil/Aleve/Motrin/Goody Powders/Naproxen/BC powders/Meloxicam/Diclofenac/Indomethacin and other Nonsteroidal anti-inflammatory medications as these will make you more likely to bleed and can cause stomach ulcers, can also cause Kidney problems.   3)Please take amlodipine, losartan and hydralazine for the blood pressure--- blood pressure recheck with primary care physician in a week advised    Admission Diagnosis  Nephrolithiasis [N20.0] Renal stone [N20.0]   Discharge Diagnosis  Nephrolithiasis [N20.0] Renal stone [N20.0]   Principal Problem:   Renal stone Active Problems:   HTN (hypertension)   Dyslipidemia   AKI (acute kidney injury) ([place])      Past Medical History:  Diagnosis Date  . Hypertension   . Thyroid disease     Past Surgical History:  Procedure Laterality Date  . ABDOMINAL HYSTERECTOMY    . CHOLECYSTECTOMY         HPI  from the history and physical done on the day of admission:    Chief Complaint: Back and lower abdominal pain  HPI: Sue Schmidt is a 50 y.o. female with medical history significant for hypertension, thyroid disease. Patient presented to the ED with complaints of lower abdominal pain for 2 to 3 weeks.  Pain is initially started as right back pain which progressed to lower abdominal pain.  She reports pain with urination, and reports she is unable to pass urine due to pain.  She reports 1  episode of vomiting here in the ED.  No fevers no chills.  Patient also had gross blood in her urine about 2 weeks ago. No personal history of kidney stones. Patient saw her primary care provider UA did not show signs of infection but she was placed on prophylactic antibiotics which she completed.  She does not know the name of the antibiotics.  ED Course: Blood pressure systolic [id number] to [id number], temperature 97.6.  WBC 9.5.  UA shows moderate hemoglobin rare bacteria 6-10 WBCs.  Renal stone study shows moderate right hydroureteronephrosis with 4 mm calculus at the ureterovesical junction. EDP talked with Dr. Alyson Ingles, plans for stents tomorrow.  Hospitalist to admit.  Review of Systems: As per HPI all other systems reviewed and negative.     Hospital Course:     1)Nephrolithiasis--right distal ureteral calculus with moderate hydronephrosis -Procedure: 1 cystoscopy 2 right retrograde pyelography 3.  Intraoperative fluoroscopy, under one hour, with interpretation 4.  Right ureteroscopic stone manipulation with basket extraction 5.  Right 6 x 26 JJ stent placement -Patient advised to pull the stent out with the tether and follow-up with Dr. Alyson Ingles in 1 week  2)HTN--BP is not at goal, add hydralazine to amlodipine and losartan follow-up with PCP for further adjustment  3)Morbid Obesity- -Low calorie diet, portion control and increase physical activity discussed with patient -Body mass index is 44.1 kg/m.  4)AKI----acute kidney injury - -creatinine trending down currently 1.53  renally adjust medications, avoid nephrotoxic agents / dehydration  / hypotension -Repeat BMP with PCP in about a week  5)Depression/Anxiety--- continue Lexapro  Discharge Condition: stable  Follow UP   Follow-up Information    McKenzie, Candee Furbish, MD. Call in 1 week.   Specialty: Urology Contact information: [address]  [place] [address] [phone]               Diet  and Activity recommendation:  As advised  Discharge Instructions    Discharge Instructions    Call MD for:  difficulty breathing, headache or visual disturbances   Complete by: As directed    Call MD for:  extreme fatigue   Complete by: As directed    Call MD for:  persistant dizziness or light-headedness   Complete by: As directed    Call MD for:  persistant nausea and vomiting   Complete by: As directed    Call MD for:  severe uncontrolled pain   Complete by: As directed    Call MD for:  temperature >100.4   Complete by: As directed    Diet - low sodium heart healthy   Complete by: As directed    Discharge instructions   Complete by: As directed    1)PLEASE REMOVE YOUR STENT IN 72 HOURS BY GENTLY PULLING THE STRING-remove her stent in 72 hours by pulling the tether -Follow-up with urologist Dr. Alyson Ingles in a week for recheck  2)Avoid ibuprofen/Advil/Aleve/Motrin/Goody Powders/Naproxen/BC powders/Meloxicam/Diclofenac/Indomethacin and other Nonsteroidal anti-inflammatory medications as these will make you more likely to bleed and can cause stomach ulcers, can also cause Kidney problems.   3)Please take amlodipine, losartan and hydralazine for the blood pressure--- blood pressure recheck with primary care physician in a week advised   Increase activity slowly   Complete by: As directed    No wound care   Complete by: As directed         Discharge Medications     Allergies as of 01/31/2021      Reactions   Tape Dermatitis   Patient describes a "burn" type reaction when tape is removed      Medication List    TAKE these medications   amLODipine 10 MG tablet Commonly known as: NORVASC Take 1 tablet by mouth daily.   atorvastatin 10 MG tablet Commonly known as: LIPITOR Take 1 tablet by mouth daily.   escitalopram 20 MG tablet Commonly known as: LEXAPRO Take 1 tablet by mouth daily.   hydrALAZINE 100 MG tablet Commonly known as: APRESOLINE Take 0.5 tablets (50 mg  total) by mouth 2 (two) times daily. For BP   hydrOXYzine 25 MG tablet Commonly known as: ATARAX/VISTARIL Take 25 mg by mouth daily as needed.   levothyroxine 175 MCG tablet Commonly known as: SYNTHROID Take 175 mcg by mouth daily.   losartan 100 MG tablet Commonly known as: COZAAR Take 1 tablet by mouth daily.   ondansetron 4 MG tablet Commonly known as: Zofran Take 1 tablet (4 mg total) by mouth daily as needed for nausea or vomiting.   oxyCODONE-acetaminophen 5-325 MG tablet Commonly known as: Percocet Take 1 tablet by mouth every 4 (four) hours as needed.   SUMAtriptan 100  MG tablet Commonly known as: IMITREX Take 100 mg by mouth as needed for headache.   Vitamin D3 50 MCG (2000 UT) Tabs Take 1 tablet by mouth daily.      Major procedures and Radiology Reports - PLEASE review detailed and final reports for all details, in brief -  DG C-Arm 1-60 Min-No Report  Result Date: 01/31/2021 Fluoroscopy was utilized by the requesting physician.  No radiographic interpretation.   CT Renal Stone Study  Result Date: 01/30/2021 CLINICAL DATA:  Acute right flank pain, dysuria. EXAM: CT ABDOMEN AND PELVIS WITHOUT CONTRAST TECHNIQUE: Multidetector CT imaging of the abdomen and pelvis was performed following the standard protocol without IV contrast. COMPARISON:  None. FINDINGS: Lower chest: No acute abnormality. Hepatobiliary: No focal liver abnormality is seen. Status post cholecystectomy. No biliary dilatation. Pancreas: Unremarkable. No pancreatic ductal dilatation or surrounding inflammatory changes. Spleen: Normal in size without focal abnormality. Adrenals/Urinary Tract: Adrenal glands appear normal. Moderate right hydroureteronephrosis is noted with perinephric stranding secondary to 4 mm calculus at right ureterovesical junction. Left kidney and ureter are unremarkable. Urinary bladder is decompressed. Stomach/Bowel: Stomach is within normal limits. Appendix appears normal. No  evidence of bowel wall thickening, distention, or inflammatory changes. Vascular/Lymphatic: No significant vascular findings are present. No enlarged abdominal or pelvic lymph nodes. Reproductive: Status post hysterectomy. No adnexal masses. Other: No abdominal wall hernia or abnormality. No abdominopelvic ascites. Musculoskeletal: No acute or significant osseous findings. IMPRESSION: Moderate right hydroureteronephrosis is noted with perinephric stranding secondary to 4 mm calculus at right ureterovesical junction. Electronically Signed   By: Marijo Conception M.D.   On: 01/30/2021 12:50   Micro Results   Recent Results (from the past 240 hour(s))  Resp Panel by RT-PCR (Flu A&B, Covid) Nasopharyngeal Swab     Status: None   Collection Time: 01/30/21  5:00 PM   Specimen: Nasopharyngeal Swab; Nasopharyngeal(NP) swabs in vial transport medium  Result Value Ref Range Status   SARS Coronavirus 2 by RT PCR NEGATIVE NEGATIVE Final    Comment: (NOTE) SARS-CoV-2 target nucleic acids are NOT DETECTED.  The SARS-CoV-2 RNA is generally detectable in upper respiratory specimens during the acute phase of infection. The lowest concentration of SARS-CoV-2 viral copies this assay can detect is 138 copies/mL. A negative result does not preclude SARS-Cov-2 infection and should not be used as the sole basis for treatment or other patient management decisions. A negative result may occur with  improper specimen collection/handling, submission of specimen other than nasopharyngeal swab, presence of viral mutation(s) within the areas targeted by this assay, and inadequate number of viral copies(<138 copies/mL). A negative result must be combined with clinical observations, patient history, and epidemiological information. The expected result is Negative.  Fact Sheet for Patients:  EntrepreneurPulse.com.au  Fact Sheet for Healthcare Providers:  IncredibleEmployment.be  This  test is no t yet approved or cleared by the [place] FDA and  has been authorized for detection and/or diagnosis of SARS-CoV-2 by FDA under an Emergency Use Authorization (EUA). This EUA will remain  in effect (meaning this test can be used) for the duration of the COVID-19 declaration under Section 564(b)(1) of the Act, 21 U.S.C.section 360bbb-3(b)(1), unless the authorization is terminated  or revoked sooner.       Influenza A by PCR NEGATIVE NEGATIVE Final   Influenza B by PCR NEGATIVE NEGATIVE Final    Comment: (NOTE) The Xpert Xpress SARS-CoV-2/FLU/RSV plus assay is intended as an aid in the diagnosis of influenza from Nasopharyngeal swab  specimens and should not be used as a sole basis for treatment. Nasal washings and aspirates are unacceptable for Xpert Xpress SARS-CoV-2/FLU/RSV testing.  Fact Sheet for Patients: EntrepreneurPulse.com.au  Fact Sheet for Healthcare Providers: IncredibleEmployment.be  This test is not yet approved or cleared by the [place] FDA and has been authorized for detection and/or diagnosis of SARS-CoV-2 by FDA under an Emergency Use Authorization (EUA). This EUA will remain in effect (meaning this test can be used) for the duration of the COVID-19 declaration under Section 564(b)(1) of the Act, 21 U.S.C. section 360bbb-3(b)(1), unless the authorization is terminated or revoked.  Performed at [facility], [address], [place],  [address]    Today   Subjective    Amany Rando today has no  Flank pain -  No Nausea, Vomiting or Diarrhea No fever  Or chills   Patient has been seen and examined prior to discharge   Objective   Blood pressure (!) 160/85, pulse 74, temperature 97.7 F (36.5 C), temperature source Oral, resp. rate 18, height 5\' 5"  (1.651 m), weight 120.2 kg, SpO2 95 %.   Intake/Output Summary (Last 24 hours) at 01/31/2021 1633 Last data filed at 01/31/2021 1535 Gross per  24 hour  Intake 3696.8 ml  Output 0 ml  Net 3696.8 ml    Exam Gen:- Awake Alert, no acute distress , morbidly obese, in no acute distress HEENT:- .AT, No sclera icterus Neck-Supple Neck,No JVD,.  Lungs-  CTAB , good air movement bilaterally  CV- S1, S2 normal, regular Abd-  +ve B.Sounds, Abd Soft, No tenderness, increased truncal adiposity, no CVA tenderness Extremity/Skin:- No  edema,   good pulses Psych-affect is appropriate, oriented x3 Neuro-no new focal deficits, no tremors    Data Review   CBC w Diff:  Lab Results  Component Value Date   WBC 8.2 01/31/2021   HGB 13.9 01/31/2021   HCT 44.0 01/31/2021   PLT 142 (L) 01/31/2021   LYMPHOPCT 12 01/30/2021   MONOPCT 7 01/30/2021   EOSPCT 1 01/30/2021   BASOPCT 1 01/30/2021    CMP:  Lab Results  Component Value Date   NA 137 01/31/2021   K 3.7 01/31/2021   CL 106 01/31/2021   CO2 24 01/31/2021   BUN 17 01/31/2021   CREATININE 1.53 (H) 01/31/2021   PROT 7.3 01/30/2021   ALBUMIN 4.1 01/30/2021   BILITOT 0.5 01/30/2021   ALKPHOS 87 01/30/2021   AST 17 01/30/2021   ALT 20 01/30/2021  .   Total Discharge time is about 33 minutes  Roxan Hockey M.D on 01/31/2021 at 4:33 PM  Go to [email or web address] -  for contact info  [facility] - Office  [phone]

## 2021-01-31 NOTE — Consult Note (Signed)
Urology Consult  Referring physician: Dr. Denton Brick Reason for referral: right ureteral calculus, intractable pain  Chief Complaint: right flank pain  History of Present Illness: Sue Schmidt is a 50yo admitted last night with intractable right flank pain from a right ureteral calculus. This is her first stone event. Starting 3 weeks ago she developed sharp, intermittent, nonradiating mild right flank pain that then became severe yesterday and she presented to the ER. Creatinine was elevated at 1.7 and CT showed a 32mm distal right ureteral calculus with moderate right hydronephrosis. She denies any LUTS. She has associated nausea and vomiting. No other associated symptoms. No exacerbating/alleviaiting events.   Past Medical History:  Diagnosis Date  . Hypertension   . Thyroid disease    Past Surgical History:  Procedure Laterality Date  . ABDOMINAL HYSTERECTOMY    . CHOLECYSTECTOMY      Medications: I have reviewed the patient's current medications. Allergies:  Allergies  Allergen Reactions  . Tape Dermatitis    Patient describes a "burn" type reaction when tape is removed    No family history on file. Social History:  reports that she has never smoked. She has never used smokeless tobacco. She reports previous alcohol use. She reports previous drug use. Drug: Marijuana.  Review of Systems  Genitourinary: Positive for flank pain and hematuria.  All other systems reviewed and are negative.   Physical Exam:  Vital signs in last 24 hours: Temp:  [97.4 F (36.3 C)-97.9 F (36.6 C)] 97.4 F (36.3 C) (05/19 0526) Pulse Rate:  [69-88] 69 (05/19 0535) Resp:  [11-24] 18 (05/19 0200) BP: (138-194)/(85-116) 154/89 (05/19 0535) SpO2:  [90 %-99 %] 96 % (05/19 0526) FiO2 (%):  [21 %] 21 % (05/18 2135) Weight:  [120.2 kg] 120.2 kg (05/18 1024) Physical Exam Vitals reviewed.  Constitutional:      Appearance: She is well-developed.  HENT:     Head: Normocephalic and atraumatic.   Cardiovascular:     Rate and Rhythm: Normal rate and regular rhythm.  Pulmonary:     Effort: Pulmonary effort is normal. No respiratory distress.  Abdominal:     General: Abdomen is flat. There is no distension.     Palpations: Abdomen is soft.  Skin:    General: Skin is warm and dry.  Neurological:     General: No focal deficit present.     Mental Status: She is alert and oriented to person, place, and time.  Psychiatric:        Mood and Affect: Mood normal.        Behavior: Behavior normal.     Laboratory Data:  Results for orders placed or performed during the hospital encounter of 01/30/21 (from the past 72 hour(s))  Urinalysis, Routine w reflex microscopic     Status: Abnormal   Collection Time: 01/30/21 10:31 AM  Result Value Ref [place]   Color, Urine YELLOW YELLOW   APPearance TURBID (A) CLEAR   Specific Gravity, Urine 1.029 1.005 - 1.030   pH 5.0 5.0 - 8.0   Glucose, UA NEGATIVE NEGATIVE mg/dL   Hgb urine dipstick MODERATE (A) NEGATIVE   Bilirubin Urine NEGATIVE NEGATIVE   Ketones, ur NEGATIVE NEGATIVE mg/dL   Protein, ur 30 (A) NEGATIVE mg/dL   Nitrite NEGATIVE NEGATIVE   Leukocytes,Ua NEGATIVE NEGATIVE   RBC / HPF 11-20 0 - 5 RBC/hpf   WBC, UA 6-10 0 - 5 WBC/hpf   Bacteria, UA RARE (A) NONE SEEN   Squamous Epithelial / LPF 6-10 0 -  5   Mucus PRESENT    Budding Yeast PRESENT    Ca Oxalate Crys, UA PRESENT     Comment: Performed at [facility], [address], [place], [place] [address]  Pregnancy, urine     Status: None   Collection Time: 01/30/21 10:31 AM  Result Value Ref [place]   Preg Test, Ur NEGATIVE NEGATIVE    Comment:        THE SENSITIVITY OF THIS METHODOLOGY IS >20 mIU/mL. Performed at [facility], [address] [place], [place] [address]   CBC with Differential     Status: Abnormal   Collection Time: 01/30/21 11:51 AM  Result Value Ref Range   WBC 9.5 4.0 - 10.5 K/uL   RBC 4.75 3.87 - 5.11 MIL/uL   Hemoglobin 15.3 (H) 12.0 - 15.0 g/dL    HCT 47.5 (H) 36.0 - 46.0 %   MCV 100.0 80.0 - 100.0 fL   MCH 32.2 26.0 - 34.0 pg   MCHC 32.2 30.0 - 36.0 g/dL   RDW 13.2 11.5 - 15.5 %   Platelets 161 150 - 400 K/uL   nRBC 0.0 0.0 - 0.2 %   Neutrophils Relative % 78 %   Neutro Abs 7.4 1.7 - 7.7 K/uL   Lymphocytes Relative 12 %   Lymphs Abs 1.1 0.7 - 4.0 K/uL   Monocytes Relative 7 %   Monocytes Absolute 0.7 0.1 - 1.0 K/uL   Eosinophils Relative 1 %   Eosinophils Absolute 0.1 0.0 - 0.5 K/uL   Basophils Relative 1 %   Basophils Absolute 0.1 0.0 - 0.1 K/uL   Immature Granulocytes 1 %   Abs Immature Granulocytes 0.05 0.00 - 0.07 K/uL    Comment: Performed at [facility], [address], [place], [place] [address]  Comprehensive metabolic panel     Status: Abnormal   Collection Time: 01/30/21 11:51 AM  Result Value Ref [place]   Sodium 138 135 - 145 mmol/L   Potassium 4.0 3.5 - 5.1 mmol/L   Chloride 103 98 - 111 mmol/L   CO2 27 22 - 32 mmol/L   Glucose, Bld 122 (H) 70 - 99 mg/dL    Comment: Glucose reference range applies only to samples taken after fasting for at least 8 hours.   BUN 24 (H) 6 - 20 mg/dL   Creatinine, Ser 1.74 (H) 0.44 - 1.00 mg/dL   Calcium 9.4 8.9 - 10.3 mg/dL   Total Protein 7.3 6.5 - 8.1 g/dL   Albumin 4.1 3.5 - 5.0 g/dL   AST 17 15 - 41 U/L   ALT 20 0 - 44 U/L   Alkaline Phosphatase 87 38 - 126 U/L   Total Bilirubin 0.5 0.3 - 1.2 mg/dL   GFR, Estimated 36 (L) >60 mL/min    Comment: (NOTE) Calculated using the CKD-EPI Creatinine Equation (2021)    Anion gap 8 5 - 15    Comment: Performed at [facility], [address] [place], [place] [address]  Lipase, blood     Status: None   Collection Time: 01/30/21 11:51 AM  Result Value Ref [place]   Lipase 40 11 - 51 U/L    Comment: Performed at [facility], [address], [place], [place] [address]  Resp Panel by RT-PCR (Flu A&B, Covid) Nasopharyngeal Swab     Status: None   Collection Time: 01/30/21  5:00 PM   Specimen: Nasopharyngeal Swab; Nasopharyngeal(NP)  swabs in vial transport medium  Result Value Ref Range   SARS Coronavirus  2 by RT PCR NEGATIVE NEGATIVE    Comment: (NOTE) SARS-CoV-2 target nucleic acids are NOT DETECTED.  The SARS-CoV-2 RNA is generally detectable in upper respiratory specimens during the acute phase of infection. The lowest concentration of SARS-CoV-2 viral copies this assay can detect is 138 copies/mL. A negative result does not preclude SARS-Cov-2 infection and should not be used as the sole basis for treatment or other patient management decisions. A negative result may occur with  improper specimen collection/handling, submission of specimen other than nasopharyngeal swab, presence of viral mutation(s) within the areas targeted by this assay, and inadequate number of viral copies(<138 copies/mL). A negative result must be combined with clinical observations, patient history, and epidemiological information. The expected result is Negative.  Fact Sheet for Patients:  EntrepreneurPulse.com.au  Fact Sheet for Healthcare Providers:  IncredibleEmployment.be  This test is no t yet approved or cleared by the [place] FDA and  has been authorized for detection and/or diagnosis of SARS-CoV-2 by FDA under an Emergency Use Authorization (EUA). This EUA will remain  in effect (meaning this test can be used) for the duration of the COVID-19 declaration under Section 564(b)(1) of the Act, 21 U.S.C.section 360bbb-3(b)(1), unless the authorization is terminated  or revoked sooner.       Influenza A by PCR NEGATIVE NEGATIVE   Influenza B by PCR NEGATIVE NEGATIVE    Comment: (NOTE) The Xpert Xpress SARS-CoV-2/FLU/RSV plus assay is intended as an aid in the diagnosis of influenza from Nasopharyngeal swab specimens and should not be used as a sole basis for treatment. Nasal washings and aspirates are unacceptable for Xpert Xpress SARS-CoV-2/FLU/RSV testing.  Fact Sheet for  Patients: EntrepreneurPulse.com.au  Fact Sheet for Healthcare Providers: IncredibleEmployment.be  This test is not yet approved or cleared by the [place] FDA and has been authorized for detection and/or diagnosis of SARS-CoV-2 by FDA under an Emergency Use Authorization (EUA). This EUA will remain in effect (meaning this test can be used) for the duration of the COVID-19 declaration under Section 564(b)(1) of the Act, 21 U.S.C. section 360bbb-3(b)(1), unless the authorization is terminated or revoked.  Performed at [facility], [address] [place], [place] [address]   Basic metabolic panel     Status: Abnormal   Collection Time: 01/31/21  5:46 AM  Result Value Ref [place]   Sodium 137 135 - 145 mmol/L   Potassium 3.7 3.5 - 5.1 mmol/L   Chloride 106 98 - 111 mmol/L   CO2 24 22 - 32 mmol/L   Glucose, Bld 130 (H) 70 - 99 mg/dL    Comment: Glucose reference range applies only to samples taken after fasting for at least 8 hours.   BUN 17 6 - 20 mg/dL   Creatinine, Ser 1.53 (H) 0.44 - 1.00 mg/dL   Calcium 8.0 (L) 8.9 - 10.3 mg/dL   GFR, Estimated 41 (L) >60 mL/min    Comment: (NOTE) Calculated using the CKD-EPI Creatinine Equation (2021)    Anion gap 7 5 - 15    Comment: Performed at [facility], [address], [place], [place] [address]  CBC     Status: Abnormal   Collection Time: 01/31/21  5:46 AM  Result Value Ref Range   WBC 8.2 4.0 - 10.5 K/uL   RBC 4.31 3.87 - 5.11 MIL/uL   Hemoglobin 13.9 12.0 - 15.0 g/dL   HCT 44.0 36.0 - 46.0 %   MCV 102.1 (H) 80.0 - 100.0 fL   MCH 32.3 26.0 - 34.0  pg   MCHC 31.6 30.0 - 36.0 g/dL   RDW 13.2 11.5 - 15.5 %   Platelets 142 (L) 150 - 400 K/uL   nRBC 0.0 0.0 - 0.2 %    Comment: Performed at [facility], [address] [place], [place] [address]   Recent Results (from the past 240 hour(s))  Resp Panel by RT-PCR (Flu A&B, Covid) Nasopharyngeal Swab     Status: None   Collection Time: 01/30/21   5:00 PM   Specimen: Nasopharyngeal Swab; Nasopharyngeal(NP) swabs in vial transport medium  Result Value Ref Range Status   SARS Coronavirus 2 by RT PCR NEGATIVE NEGATIVE Final    Comment: (NOTE) SARS-CoV-2 target nucleic acids are NOT DETECTED.  The SARS-CoV-2 RNA is generally detectable in upper respiratory specimens during the acute phase of infection. The lowest concentration of SARS-CoV-2 viral copies this assay can detect is 138 copies/mL. A negative result does not preclude SARS-Cov-2 infection and should not be used as the sole basis for treatment or other patient management decisions. A negative result may occur with  improper specimen collection/handling, submission of specimen other than nasopharyngeal swab, presence of viral mutation(s) within the areas targeted by this assay, and inadequate number of viral copies(<138 copies/mL). A negative result must be combined with clinical observations, patient history, and epidemiological information. The expected result is Negative.  Fact Sheet for Patients:  EntrepreneurPulse.com.au  Fact Sheet for Healthcare Providers:  IncredibleEmployment.be  This test is no t yet approved or cleared by the [place] FDA and  has been authorized for detection and/or diagnosis of SARS-CoV-2 by FDA under an Emergency Use Authorization (EUA). This EUA will remain  in effect (meaning this test can be used) for the duration of the COVID-19 declaration under Section 564(b)(1) of the Act, 21 U.S.C.section 360bbb-3(b)(1), unless the authorization is terminated  or revoked sooner.       Influenza A by PCR NEGATIVE NEGATIVE Final   Influenza B by PCR NEGATIVE NEGATIVE Final    Comment: (NOTE) The Xpert Xpress SARS-CoV-2/FLU/RSV plus assay is intended as an aid in the diagnosis of influenza from Nasopharyngeal swab specimens and should not be used as a sole basis for treatment. Nasal washings and aspirates  are unacceptable for Xpert Xpress SARS-CoV-2/FLU/RSV testing.  Fact Sheet for Patients: EntrepreneurPulse.com.au  Fact Sheet for Healthcare Providers: IncredibleEmployment.be  This test is not yet approved or cleared by the [place] FDA and has been authorized for detection and/or diagnosis of SARS-CoV-2 by FDA under an Emergency Use Authorization (EUA). This EUA will remain in effect (meaning this test can be used) for the duration of the COVID-19 declaration under Section 564(b)(1) of the Act, 21 U.S.C. section 360bbb-3(b)(1), unless the authorization is terminated or revoked.  Performed at [facility], [address], [place], [place] [address]    Creatinine: Recent Labs    01/30/21 1151 01/31/21 0546  CREATININE 1.74* 1.53*   Baseline Creatinine: 1  Impression/Assessment:  50yo with a right ureteral calculus, intractable pain  Plan:  -We discussed the management of kidney stones. These options include observation, ureteroscopy, shockwave lithotripsy (ESWL) and percutaneous nephrolithotomy (PCNL). We discussed which options are relevant to the patient's stone(s). We discussed the natural history of kidney stones as well as the complications of untreated stones and the impact on quality of life without treatment as well as with each of the above listed treatments. We also discussed the efficacy of each treatment in its ability to clear the stone burden. With any  of these management options I discussed the signs and symptoms of infection and the need for emergent treatment should these be experienced. For each option we discussed the ability of each procedure to clear the patient of their stone burden.   For observation I described the risks which include but are not limited to silent renal damage, life-threatening infection, need for emergent surgery, failure to pass stone and pain.   For ureteroscopy I described the risks which include  bleeding, infection, damage to contiguous structures, positioning injury, ureteral stricture, ureteral avulsion, ureteral injury, need for prolonged ureteral stent, inability to perform ureteroscopy, need for an interval procedure, inability to clear stone burden, stent discomfort/pain, heart attack, stroke, pulmonary embolus and the inherent risks with general anesthesia.   For shockwave lithotripsy I described the risks which include arrhythmia, kidney contusion, kidney hemorrhage, need for transfusion, pain, inability to adequately break up stone, inability to pass stone fragments, Steinstrasse, infection associated with obstructing stones, need for alternate surgical procedure, need for repeat shockwave lithotripsy, MI, CVA, PE and the inherent risks with anesthesia/conscious sedation.   For PCNL I described the risks including positioning injury, pneumothorax, hydrothorax, need for chest tube, inability to clear stone burden, renal laceration, arterial venous fistula or malformation, need for embolization of kidney, loss of kidney or renal function, need for repeat procedure, need for prolonged nephrostomy tube, ureteral avulsion, MI, CVA, PE and the inherent risks of general anesthesia.   - The patient would like to proceed with right ureteroscopic stone extraction  Nicolette Bang 01/31/2021, 10:02 AM

## 2021-01-31 NOTE — Anesthesia Postprocedure Evaluation (Signed)
Anesthesia Post Note  Patient: Occupational hygienist  Procedure(s) Performed: CYSTOSCOPY WITH RIGHT RETROGRADE PYELOGRAM, RIGHT URETEROSCOPY AND RIGHT URETERAL STENT PLACEMENT (Right ) STONE EXTRACTION WITH BASKET (Right )  Patient location during evaluation: Phase II Anesthesia Type: General Level of consciousness: awake Pain management: pain level controlled Vital Signs Assessment: post-procedure vital signs reviewed and stable Respiratory status: spontaneous breathing and respiratory function stable Cardiovascular status: blood pressure returned to baseline and stable Postop Assessment: no headache and no apparent nausea or vomiting Anesthetic complications: no Comments: Late entry   No complications documented.   Last Vitals:  Vitals:   01/31/21 1157 01/31/21 1224  BP: (!) 177/104 (!) 177/104  Pulse: 85   Resp: 20   Temp: 36.7 C   SpO2: 94%     Last Pain:  Vitals:   01/31/21 1157  TempSrc: Oral  PainSc:                  Louann Sjogren

## 2021-01-31 NOTE — Progress Notes (Signed)
Nsg Discharge Note  Admit Date:  01/30/2021 Discharge date: 01/31/2021   [place] to be D/C'd Home per MD order.  AVS completed.  Copy for chart, and copy for patient signed, and dated. Patient/caregiver able to verbalize understanding.  Discharge Medication: Allergies as of 01/31/2021      Reactions   Tape Dermatitis   Patient describes a "burn" type reaction when tape is removed      Medication List    TAKE these medications   amLODipine 10 MG tablet Commonly known as: NORVASC Take 1 tablet by mouth daily.   atorvastatin 10 MG tablet Commonly known as: LIPITOR Take 1 tablet by mouth daily.   escitalopram 20 MG tablet Commonly known as: LEXAPRO Take 1 tablet by mouth daily.   hydrALAZINE 100 MG tablet Commonly known as: APRESOLINE Take 0.5 tablets (50 mg total) by mouth 2 (two) times daily. For BP   hydrOXYzine 25 MG tablet Commonly known as: ATARAX/VISTARIL Take 25 mg by mouth daily as needed.   levothyroxine 175 MCG tablet Commonly known as: SYNTHROID Take 175 mcg by mouth daily.   losartan 100 MG tablet Commonly known as: COZAAR Take 1 tablet by mouth daily.   ondansetron 4 MG tablet Commonly known as: Zofran Take 1 tablet (4 mg total) by mouth daily as needed for nausea or vomiting.   oxyCODONE-acetaminophen 5-325 MG tablet Commonly known as: Percocet Take 1 tablet by mouth every 4 (four) hours as needed.   SUMAtriptan 100 MG tablet Commonly known as: IMITREX Take 100 mg by mouth as needed for headache.   Vitamin D3 50 MCG (2000 UT) Tabs Take 1 tablet by mouth daily.       Discharge Assessment: Vitals:   01/31/21 1500 01/31/21 1823  BP: (!) 160/85 (!) 156/94  Pulse: 74 82  Resp: 18   Temp: 97.7 F (36.5 C)   SpO2: 95%    Skin clean, dry and intact without evidence of skin break down, no evidence of skin tears noted. IV catheter discontinued intact. Site without signs and symptoms of complications - no redness or edema noted at insertion  site, patient denies c/o pain - only slight tenderness at site.  Dressing with slight pressure applied.  D/c Instructions-Education: Discharge instructions given to patient/family with verbalized understanding. D/c education completed with patient/family including follow up instructions, medication list, d/c activities limitations if indicated, with other d/c instructions as indicated by MD - patient able to verbalize understanding, all questions fully answered. Patient instructed to return to ED, call 911, or call MD for any changes in condition.  Patient escorted via [place], and D/C home via private auto.  Dorcas Mcmurray, LPN 0/62/3762 8:31 PM

## 2021-02-01 ENCOUNTER — Encounter (HOSPITAL_COMMUNITY): Payer: Self-pay | Admitting: Urology

## 2021-02-01 LAB — URINE CULTURE

## 2021-02-05 LAB — CALCULI, WITH PHOTOGRAPH (CLINICAL LAB)
Calcium Oxalate Monohydrate: 100 %
Weight Calculi: 14 mg

## 2021-02-12 ENCOUNTER — Telehealth (INDEPENDENT_AMBULATORY_CARE_PROVIDER_SITE_OTHER): Payer: Medicaid Other | Admitting: Urology

## 2021-02-12 ENCOUNTER — Encounter: Payer: Self-pay | Admitting: Urology

## 2021-02-12 ENCOUNTER — Other Ambulatory Visit: Payer: Self-pay

## 2021-02-12 DIAGNOSIS — N200 Calculus of kidney: Secondary | ICD-10-CM | POA: Diagnosis not present

## 2021-02-12 NOTE — Progress Notes (Signed)
02/12/2021 2:46 PM   Sue Schmidt Jun 14, 1971 [id number]  Referring provider: Ludwig Schmidt, [place],  [place] [id number]  Patient location: home Physician location: office I connected with  Sue Schmidt on 02/12/21 by a video enabled telemedicine application and verified that I am speaking with the correct person using two identifiers.   I discussed the limitations of evaluation and management by telemedicine. The patient expressed understanding and agreed to proceed.   nephrolithiasis  HPI: Sue Schmidt is a 50yo seen today for followup for nephrolithiasis. She underwent right ureteroscopic stone extraction 2 weeks ago. She removed her stent POD#3. She denies any flank pain. No LUTS. No complaints today.    PMH: Past Medical History:  Diagnosis Date  . Hypertension   . Thyroid disease     Surgical History: Past Surgical History:  Procedure Laterality Date  . ABDOMINAL HYSTERECTOMY    . CHOLECYSTECTOMY    . CYSTOSCOPY WITH RETROGRADE PYELOGRAM, URETEROSCOPY AND STENT PLACEMENT Right 01/31/2021   Procedure: CYSTOSCOPY WITH RIGHT RETROGRADE PYELOGRAM, RIGHT URETEROSCOPY AND RIGHT URETERAL STENT PLACEMENT;  Surgeon: Sue Gustin, MD;  Location: [facility];  Service: Urology;  Laterality: Right;  . STONE EXTRACTION WITH BASKET Right 01/31/2021   Procedure: STONE EXTRACTION WITH BASKET;  Surgeon: Sue Gustin, MD;  Location: [facility];  Service: Urology;  Laterality: Right;    Home Medications:  Allergies as of 02/12/2021      Reactions   Tape Dermatitis   Patient describes a "burn" type reaction when tape is removed      Medication List       Accurate as of Feb 12, 2021  2:46 PM. If you have any questions, ask your nurse or doctor.        amLODipine 10 MG tablet Commonly known as: NORVASC Take 1 tablet by mouth daily.   atorvastatin 10 MG tablet Commonly known as: LIPITOR Take 1 tablet by mouth daily.   escitalopram 20 MG tablet Commonly  known as: LEXAPRO Take 1 tablet by mouth daily.   hydrALAZINE 100 MG tablet Commonly known as: APRESOLINE Take 0.5 tablets (50 mg total) by mouth 2 (two) times daily. For BP   hydrOXYzine 25 MG tablet Commonly known as: ATARAX/VISTARIL Take 25 mg by mouth daily as needed.   levothyroxine 175 MCG tablet Commonly known as: SYNTHROID Take 175 mcg by mouth daily.   losartan 100 MG tablet Commonly known as: COZAAR Take 1 tablet by mouth daily.   ondansetron 4 MG tablet Commonly known as: Zofran Take 1 tablet (4 mg total) by mouth daily as needed for nausea or vomiting.   oxyCODONE-acetaminophen 5-325 MG tablet Commonly known as: Percocet Take 1 tablet by mouth every 4 (four) hours as needed.   SUMAtriptan 100 MG tablet Commonly known as: IMITREX Take 100 mg by mouth as needed for headache.   Vitamin D3 50 MCG (2000 UT) Tabs Take 1 tablet by mouth daily.       Allergies:  Allergies  Allergen Reactions  . Tape Dermatitis    Patient describes a "burn" type reaction when tape is removed    Family History: No family history on file.  Social History:  reports that she has never smoked. She has never used smokeless tobacco. She reports previous alcohol use. She reports previous drug use. Drug: Marijuana.  ROS: All other review of systems were reviewed and are negative except what is noted above in HPI  Physical Exam: There were no vitals taken  for this visit.  Constitutional:  Alert and oriented, No acute distress. HEENT: [place] AT, moist mucus membranes.  Trachea midline, no masses. Cardiovascular: No clubbing, cyanosis, or edema. Respiratory: Normal respiratory effort, no increased work of breathing. GI: Abdomen is soft, nontender, nondistended, no abdominal masses GU: No CVA tenderness.  Lymph: No cervical or inguinal lymphadenopathy. Skin: No rashes, bruises or suspicious lesions. Neurologic: Grossly intact, no focal deficits, moving all 4 extremities. Psychiatric:  Normal mood and affect.  Laboratory Data: Lab Results  Component Value Date   WBC 8.2 01/31/2021   HGB 13.9 01/31/2021   HCT 44.0 01/31/2021   MCV 102.1 (H) 01/31/2021   PLT 142 (L) 01/31/2021    Lab Results  Component Value Date   CREATININE 1.53 (H) 01/31/2021    No results found for: PSA  No results found for: TESTOSTERONE  No results found for: HGBA1C  Urinalysis    Component Value Date/Time   COLORURINE YELLOW 01/30/2021 1031   APPEARANCEUR TURBID (A) 01/30/2021 1031   LABSPEC 1.029 01/30/2021 1031   PHURINE 5.0 01/30/2021 1031   GLUCOSEU NEGATIVE 01/30/2021 1031   HGBUR MODERATE (A) 01/30/2021 1031   BILIRUBINUR NEGATIVE 01/30/2021 1031   [address] 01/30/2021 1031   PROTEINUR 30 (A) 01/30/2021 1031   NITRITE NEGATIVE 01/30/2021 1031   LEUKOCYTESUR NEGATIVE 01/30/2021 1031    Lab Results  Component Value Date   BACTERIA RARE (A) 01/30/2021    Pertinent Imaging:  No results found for this or any previous visit.  No results found for this or any previous visit.  No results found for this or any previous visit.  No results found for this or any previous visit.  No results found for this or any previous visit.  No results found for this or any previous visit.  No results found for this or any previous visit.  Results for orders placed during the hospital encounter of 01/30/21  CT Renal Stone Study  Narrative CLINICAL DATA:  Acute right flank pain, dysuria.  EXAM: CT ABDOMEN AND PELVIS WITHOUT CONTRAST  TECHNIQUE: Multidetector CT imaging of the abdomen and pelvis was performed following the standard protocol without IV contrast.  COMPARISON:  None.  FINDINGS: Lower chest: No acute abnormality.  Hepatobiliary: No focal liver abnormality is seen. Status post cholecystectomy. No biliary dilatation.  Pancreas: Unremarkable. No pancreatic ductal dilatation or surrounding inflammatory changes.  Spleen: Normal in size without  focal abnormality.  Adrenals/Urinary Tract: Adrenal glands appear normal. Moderate right hydroureteronephrosis is noted with perinephric stranding secondary to 4 mm calculus at right ureterovesical junction. Left kidney and ureter are unremarkable. Urinary bladder is decompressed.  Stomach/Bowel: Stomach is within normal limits. Appendix appears normal. No evidence of bowel wall thickening, distention, or inflammatory changes.  Vascular/Lymphatic: No significant vascular findings are present. No enlarged abdominal or pelvic lymph nodes.  Reproductive: Status post hysterectomy. No adnexal masses.  Other: No abdominal wall hernia or abnormality. No abdominopelvic ascites.  Musculoskeletal: No acute or significant osseous findings.  IMPRESSION: Moderate right hydroureteronephrosis is noted with perinephric stranding secondary to 4 mm calculus at right ureterovesical junction.   Electronically Signed By: Marijo Conception M.D. On: 01/30/2021 12:50   Assessment & Plan:    1. Nephrolithiasis -RTC 1 month with a renal US    No follow-ups on file.  Nicolette Bang, MD  [facility] [place]

## 2021-02-12 NOTE — Patient Instructions (Signed)

## 2021-02-25 ENCOUNTER — Ambulatory Visit (HOSPITAL_COMMUNITY): Payer: Medicaid Other

## 2021-02-25 ENCOUNTER — Encounter (HOSPITAL_COMMUNITY): Payer: Self-pay

## 2021-03-11 ENCOUNTER — Ambulatory Visit: Payer: Medicaid Other | Admitting: Urology

## 2021-04-01 ENCOUNTER — Ambulatory Visit (HOSPITAL_COMMUNITY): Payer: Medicaid Other

## 2021-04-01 ENCOUNTER — Other Ambulatory Visit: Payer: Self-pay

## 2021-04-01 ENCOUNTER — Ambulatory Visit (HOSPITAL_COMMUNITY)
Admission: RE | Admit: 2021-04-01 | Discharge: 2021-04-01 | Disposition: A | Discharge status: Home / Self Care | Payer: Medicaid Other | Source: Ambulatory Visit | Attending: Urology | Admitting: Urology

## 2021-04-01 DIAGNOSIS — N200 Calculus of kidney: Secondary | ICD-10-CM | POA: Insufficient documentation

## 2021-04-24 ENCOUNTER — Telehealth (INDEPENDENT_AMBULATORY_CARE_PROVIDER_SITE_OTHER): Payer: Medicaid Other | Admitting: Urology

## 2021-04-24 ENCOUNTER — Encounter: Payer: Self-pay | Admitting: Urology

## 2021-04-24 ENCOUNTER — Other Ambulatory Visit: Payer: Self-pay

## 2021-04-24 DIAGNOSIS — N200 Calculus of kidney: Secondary | ICD-10-CM | POA: Diagnosis not present

## 2021-04-24 NOTE — Progress Notes (Signed)
04/24/2021 10:45 AM   Sue Schmidt 1970/12/22 [id number]  Referring provider: Ludwig Clarks, [address] [place],  [place] [address]  Patient location: home Physician location: office I connected with  Dagoberto Reef on 04/24/21 by a video enabled telemedicine application and verified that I am speaking with the correct person using two identifiers.   I discussed the limitations of evaluation and management by telemedicine. The patient expressed understanding and agreed to proceed.    Followup nephrolithiasis   HPI: Sue Schmidt is a 50yo here for followup for nephrolithiasis. No stone events since last visit. No flank pain. Renal US from 7/18 shows no calculi and no hydronephrosis. No other complaints today.   PMH: Past Medical History:  Diagnosis Date   Hypertension    Thyroid disease     Surgical History: Past Surgical History:  Procedure Laterality Date   ABDOMINAL HYSTERECTOMY     CHOLECYSTECTOMY     CYSTOSCOPY WITH RETROGRADE PYELOGRAM, URETEROSCOPY AND STENT PLACEMENT Right 01/31/2021   Procedure: CYSTOSCOPY WITH RIGHT RETROGRADE PYELOGRAM, RIGHT URETEROSCOPY AND RIGHT URETERAL STENT PLACEMENT;  Surgeon: Cleon Gustin, MD;  Location: [facility];  Service: Urology;  Laterality: Right;   STONE EXTRACTION WITH BASKET Right 01/31/2021   Procedure: STONE EXTRACTION WITH BASKET;  Surgeon: Cleon Gustin, MD;  Location: [facility];  Service: Urology;  Laterality: Right;    Home Medications:  Allergies as of 04/24/2021       Reactions   Tape Dermatitis   Patient describes a "burn" type reaction when tape is removed        Medication List        Accurate as of April 24, 2021 10:45 AM. If you have any questions, ask your nurse or doctor.          amLODipine 10 MG tablet Commonly known as: NORVASC Take 1 tablet by mouth daily.   atorvastatin 10 MG tablet Commonly known as: LIPITOR Take 1 tablet by mouth daily.   escitalopram 20 MG tablet Commonly  known as: LEXAPRO Take 1 tablet by mouth daily.   hydrALAZINE 100 MG tablet Commonly known as: APRESOLINE Take 0.5 tablets (50 mg total) by mouth 2 (two) times daily. For BP   hydrOXYzine 25 MG tablet Commonly known as: ATARAX/VISTARIL Take 25 mg by mouth daily as needed.   levothyroxine 175 MCG tablet Commonly known as: SYNTHROID Take 175 mcg by mouth daily.   losartan 100 MG tablet Commonly known as: COZAAR Take 1 tablet by mouth daily.   ondansetron 4 MG tablet Commonly known as: Zofran Take 1 tablet (4 mg total) by mouth daily as needed for nausea or vomiting.   oxyCODONE-acetaminophen 5-325 MG tablet Commonly known as: Percocet Take 1 tablet by mouth every 4 (four) hours as needed.   SUMAtriptan 100 MG tablet Commonly known as: IMITREX Take 100 mg by mouth as needed for headache.   Vitamin D3 50 MCG (2000 UT) Tabs Take 1 tablet by mouth daily.        Allergies:  Allergies  Allergen Reactions   Tape Dermatitis    Patient describes a "burn" type reaction when tape is removed    Family History: No family history on file.  Social History:  reports that she has never smoked. She has never used smokeless tobacco. She reports previous alcohol use. She reports previous drug use. Drug: Marijuana.  ROS: All other review of systems were reviewed and are negative except what is noted above in HPI  Laboratory Data: Lab Results  Component Value Date   WBC 8.2 01/31/2021   HGB 13.9 01/31/2021   HCT 44.0 01/31/2021   MCV 102.1 (H) 01/31/2021   PLT 142 (L) 01/31/2021    Lab Results  Component Value Date   CREATININE 1.53 (H) 01/31/2021    No results found for: PSA  No results found for: TESTOSTERONE  No results found for: HGBA1C  Urinalysis    Component Value Date/Time   COLORURINE YELLOW 01/30/2021 1031   APPEARANCEUR TURBID (A) 01/30/2021 1031   LABSPEC 1.029 01/30/2021 1031   PHURINE 5.0 01/30/2021 1031   GLUCOSEU NEGATIVE 01/30/2021 1031    HGBUR MODERATE (A) 01/30/2021 1031   BILIRUBINUR NEGATIVE 01/30/2021 [place] 01/30/2021 1031   PROTEINUR 30 (A) 01/30/2021 1031   NITRITE NEGATIVE 01/30/2021 1031   LEUKOCYTESUR NEGATIVE 01/30/2021 1031    Lab Results  Component Value Date   BACTERIA RARE (A) 01/30/2021    Pertinent Imaging: Renal US 04/01/2021: Images reviewed and discussed with the patient No results found for this or any previous visit.  No results found for this or any previous visit.  No results found for this or any previous visit.  No results found for this or any previous visit.  Results for orders placed during the hospital encounter of 04/01/21  Ultrasound renal complete  Narrative CLINICAL DATA:  Nephrolithiasis  EXAM: RENAL / URINARY TRACT ULTRASOUND COMPLETE  COMPARISON:  Renal stone CT 01/30/2021  FINDINGS: Right Kidney:  Renal measurements: 11.3 x 5.7 x 5.0 cm = volume: 167 mL. Echogenicity within normal limits. No mass or hydronephrosis visualized.  Left Kidney:  Renal measurements: 11.5 x 6.7 x 5.1 cm = volume: 204 mL. Echogenicity within normal limits. No mass or hydronephrosis visualized.  Bladder:  Appears normal for degree of bladder distention.  Other:  None.  IMPRESSION: Normal renal ultrasound   Electronically Signed By: Miachel Roux M.D. On: 04/01/2021 13:07  No results found for this or any previous visit.  No results found for this or any previous visit.  Results for orders placed during the hospital encounter of 01/30/21  CT Renal Stone Study  Narrative CLINICAL DATA:  Acute right flank pain, dysuria.  EXAM: CT ABDOMEN AND PELVIS WITHOUT CONTRAST  TECHNIQUE: Multidetector CT imaging of the abdomen and pelvis was performed following the standard protocol without IV contrast.  COMPARISON:  None.  FINDINGS: Lower chest: No acute abnormality.  Hepatobiliary: No focal liver abnormality is seen. Status post cholecystectomy.  No biliary dilatation.  Pancreas: Unremarkable. No pancreatic ductal dilatation or surrounding inflammatory changes.  Spleen: Normal in size without focal abnormality.  Adrenals/Urinary Tract: Adrenal glands appear normal. Moderate right hydroureteronephrosis is noted with perinephric stranding secondary to 4 mm calculus at right ureterovesical junction. Left kidney and ureter are unremarkable. Urinary bladder is decompressed.  Stomach/Bowel: Stomach is within normal limits. Appendix appears normal. No evidence of bowel wall thickening, distention, or inflammatory changes.  Vascular/Lymphatic: No significant vascular findings are present. No enlarged abdominal or pelvic lymph nodes.  Reproductive: Status post hysterectomy. No adnexal masses.  Other: No abdominal wall hernia or abnormality. No abdominopelvic ascites.  Musculoskeletal: No acute or significant osseous findings.  IMPRESSION: Moderate right hydroureteronephrosis is noted with perinephric stranding secondary to 4 mm calculus at right ureterovesical junction.   Electronically Signed By: Marijo Conception M.D. On: 01/30/2021 12:50   Assessment & Plan:    1. Nephrolithiasis Dietary handout provided. RTC 1 year with renal US  No follow-ups on file.  Nicolette Bang, MD  [facility] [place]

## 2021-04-24 NOTE — Patient Instructions (Signed)
Textbook of Natural Medicine (5th ed., pp. 1518-1527.e3). [place], [place]: Elsevier.">  Dietary Guidelines to Help Prevent Kidney Stones Kidney stones are deposits of minerals and salts that form inside your kidneys. Your risk of developing kidney stones may be greater depending on your diet, your lifestyle, the medicines you take, and whether you have certain medical conditions. Most people can lower their chances of developing kidney stones by following the instructions below. Your dietitian may give you more specific instructions depending on your overall health and the type of kidney stones youtend to develop. What are tips for following this plan? Reading food labels  Choose foods with "no salt added" or "low-salt" labels. Limit your salt (sodium) intake to less than 1,500 mg a day. Choose foods with calcium for each meal and snack. Try to eat about 300 mg of calcium at each meal. Foods that contain 200-500 mg of calcium a serving include: 8 oz (237 mL) of milk, calcium-fortifiednon-dairy milk, and calcium-fortifiedfruit juice. Calcium-fortified means that calcium has been added to these drinks. 8 oz (237 mL) of kefir, yogurt, and soy yogurt. 4 oz (114 g) of tofu. 1 oz (28 g) of cheese. 1 cup (150 g) of dried figs. 1 cup (91 g) of cooked broccoli. One 3 oz (85 g) can of sardines or mackerel. Most people need 1,000-1,500 mg of calcium a day. Talk to your dietitian abouthow much calcium is recommended for you. Shopping Buy plenty of fresh fruits and vegetables. Most people do not need to avoid fruits and vegetables, even if these foods contain nutrients that may contribute to kidney stones. When shopping for convenience foods, choose: Whole pieces of fruit. Pre-made salads with dressing on the side. Low-fat fruit and yogurt smoothies. Avoid buying frozen meals or prepared deli foods. These can be high in sodium. Look for foods with live cultures, such as yogurt and kefir. Choose high-fiber  grains, such as whole-wheat breads, oat bran, and wheat cereals. Cooking Do not add salt to food when cooking. Place a salt shaker on the table and allow each person to add his or her own salt to taste. Use vegetable protein, such as beans, textured vegetable protein (TVP), or tofu, instead of meat in pasta, casseroles, and soups. Meal planning Eat less salt, if told by your dietitian. To do this: Avoid eating processed or pre-made food. Avoid eating fast food. Eat less animal protein, including cheese, meat, poultry, or fish, if told by your dietitian. To do this: Limit the number of times you have meat, poultry, fish, or cheese each week. Eat a diet free of meat at least 2 days a week. Eat only one serving each day of meat, poultry, fish, or seafood. When you prepare animal protein, cut pieces into small portion sizes. For most meat and fish, one serving is about the size of the palm of your hand. Eat at least five servings of fresh fruits and vegetables each day. To do this: Keep fruits and vegetables on hand for snacks. Eat one piece of fruit or a handful of berries with breakfast. Have a salad and fruit at lunch. Have two kinds of vegetables at dinner. Limit foods that are high in a substance called oxalate. These include: Spinach (cooked), rhubarb, beets, sweet potatoes, and Swiss chard. Peanuts. Potato chips, french fries, and baked potatoes with skin on. Nuts and nut products. Chocolate. If you regularly take a diuretic medicine, make sure to eat at least 1 or 2 servings of fruits or vegetables that are   high in potassium each day. These include: Avocado. Banana. Orange, prune, carrot, or tomato juice. Baked potato. Cabbage. Beans and split peas. Lifestyle  Drink enough fluid to keep your urine pale yellow. This is the most important thing you can do. Spread your fluid intake throughout the day. If you drink alcohol: Limit how much you use to: 0-1 drink a day for women who  are not pregnant. 0-2 drinks a day for men. Be aware of how much alcohol is in your drink. In the [place] one drink equals one 12 oz bottle of beer (355 mL), one 5 oz glass of wine (148 mL), or one 1 oz glass of hard liquor (44 mL). Lose weight if told by your health care provider. Work with your dietitian to find an eating plan and weight loss strategies that work best for you.  General information Talk to your health care provider and dietitian about taking daily supplements. You may be told the following depending on your health and the cause of your kidney stones: Not to take supplements with vitamin C. To take a calcium supplement. To take a daily probiotic supplement. To take other supplements such as magnesium, fish oil, or vitamin B6. Take over-the-counter and prescription medicines only as told by your health care provider. These include supplements. What foods should I limit? Limit your intake of the following foods, or eat them as told by your dietitian. Vegetables Spinach. Rhubarb. Beets. Canned vegetables. Pickles. Olives. Baked potatoeswith skin. Grains Wheat bran. Baked goods. Salted crackers. Cereals high in sugar. Meats and other proteins Nuts. Nut butters. Large portions of meat, poultry, or fish. Salted, precooked,or cured meats, such as sausages, meat loaves, and hot dogs. Dairy Cheese. Beverages Regular soft drinks. Regular vegetable juice. Seasonings and condiments Seasoning blends with salt. Salad dressings. Soy sauce. Ketchup. Barbecue sauce. Other foods Canned soups. Canned pasta sauce. Casseroles. Pizza. Lasagna. Frozen meals.Potato chips. French fries. The items listed above may not be a complete list of foods and beverages you should limit. Contact a dietitian for more information. What foods should I avoid? Talk to your dietitian about specific foods you should avoid based on the typeof kidney stones you have and your overall health. Fruits Grapefruit. The  item listed above may not be a complete list of foods and beverages you should avoid. Contact a dietitian for more information. Summary Kidney stones are deposits of minerals and salts that form inside your kidneys. You can lower your risk of kidney stones by making changes to your diet. The most important thing you can do is drink enough fluid. Drink enough fluid to keep your urine pale yellow. Talk to your dietitian about how much calcium you should have each day, and eat less salt and animal protein as told by your dietitian. This information is not intended to replace advice given to you by your health care provider. Make sure you discuss any questions you have with your healthcare provider. Document Revised: 08/25/2019 Document Reviewed: 08/25/2019 Elsevier Patient Education  2022 Elsevier Inc.  

## 2021-05-21 NOTE — Progress Notes (Signed)
Results printed and mailed.   

## 2022-04-18 ENCOUNTER — Ambulatory Visit (HOSPITAL_COMMUNITY): Payer: Medicaid Other

## 2022-04-24 ENCOUNTER — Ambulatory Visit (HOSPITAL_COMMUNITY): Payer: Medicaid Other | Attending: Urology

## 2022-04-25 ENCOUNTER — Ambulatory Visit: Payer: Medicaid Other | Admitting: Urology

## 2022-04-29 ENCOUNTER — Ambulatory Visit: Payer: Medicaid Other | Admitting: Urology

## 2022-04-29 DIAGNOSIS — N200 Calculus of kidney: Secondary | ICD-10-CM

## 2022-07-21 ENCOUNTER — Encounter: Payer: Self-pay | Admitting: Internal Medicine

## 2022-08-27 ENCOUNTER — Ambulatory Visit: Payer: Medicaid Other | Admitting: Gastroenterology

## 2022-09-18 ENCOUNTER — Ambulatory Visit: Payer: Medicaid Other | Admitting: Gastroenterology

## 2022-09-18 ENCOUNTER — Encounter: Payer: Self-pay | Admitting: Gastroenterology

## 2022-09-18 VITALS — BP 119/88 | HR 84 | Temp 97.2°F | Ht 64.5 in | Wt 183.0 lb

## 2022-09-18 DIAGNOSIS — R195 Other fecal abnormalities: Secondary | ICD-10-CM

## 2022-09-18 DIAGNOSIS — R1013 Epigastric pain: Secondary | ICD-10-CM | POA: Diagnosis not present

## 2022-09-18 NOTE — Patient Instructions (Signed)
We are arranging a colonoscopy and upper endoscopy with Dr. Abbey Chatters in the near future.  Please call next week with the schedule!  Have a great weekend!  It was a pleasure to see you today. I want to create trusting relationships with patients to provide genuine, compassionate, and quality care. I value your feedback. If you receive a survey regarding your visit,  I greatly appreciate you taking time to fill this out.   Annitta Needs, PhD, ANP-BC [facility]

## 2022-09-18 NOTE — Progress Notes (Signed)
Gastroenterology Office Note    Referring Provider: Ludwig Clarks, FNP Primary Care Physician:  Ludwig Clarks, FNP  Primary GI: Dr. Abbey Chatters    Chief Complaint   Chief Complaint  Patient presents with   New Patient (Initial Visit)    Positive Cologuard test      History of Present Illness   Sue Schmidt is a 52 y.o. female presenting today at the request of Ludwig Clarks, FNP due to positive Cologuard test in Oct 2023.    Weight is fluctuating. In October was 195. Now 183. No appetite. Early satiety. No GERD or dysphagia. Will have epigastric pain intermittently. Feels like stabbing pain. No melena or hematochezia. Stool looks like a gelatin for past 4 months. Will have solid BM if eating tacos off the taco truck. Lots of mucus.   No prior colonoscopy. No FH colon cancer or polyps.     Past Medical History:  Diagnosis Date   Hypertension    Thyroid disease     Past Surgical History:  Procedure Laterality Date   ABDOMINAL HYSTERECTOMY     CHOLECYSTECTOMY     CYSTOSCOPY WITH RETROGRADE PYELOGRAM, URETEROSCOPY AND STENT PLACEMENT Right 01/31/2021   Procedure: CYSTOSCOPY WITH RIGHT RETROGRADE PYELOGRAM, RIGHT URETEROSCOPY AND RIGHT URETERAL STENT PLACEMENT;  Surgeon: Cleon Gustin, MD;  Location: [facility];  Service: Urology;  Laterality: Right;   STONE EXTRACTION WITH BASKET Right 01/31/2021   Procedure: STONE EXTRACTION WITH BASKET;  Surgeon: Cleon Gustin, MD;  Location: [facility];  Service: Urology;  Laterality: Right;    Current Outpatient Medications  Medication Sig Dispense Refill   amLODipine (NORVASC) 10 MG tablet Take 1 tablet by mouth daily.     atorvastatin (LIPITOR) 10 MG tablet Take 1 tablet by mouth daily.     Cholecalciferol (VITAMIN D3) 50 MCG (2000 UT) TABS Take 1 tablet by mouth daily.     escitalopram (LEXAPRO) 20 MG tablet Take 1 tablet by mouth daily.     hydrALAZINE (APRESOLINE) 100 MG tablet Take 0.5 tablets (50 mg total) by mouth 2  (two) times daily. For BP 60 tablet 3   levothyroxine (SYNTHROID) 175 MCG tablet Take 175 mcg by mouth daily.     losartan (COZAAR) 100 MG tablet Take 1 tablet by mouth daily.     SUMAtriptan (IMITREX) 100 MG tablet Take 100 mg by mouth as needed for headache.     No current facility-administered medications for this visit.    Allergies as of 09/18/2022 - Review Complete 09/18/2022  Allergen Reaction Noted   Tape Dermatitis 01/31/2021    Family History  Problem Relation Age of Onset   Colon cancer Neg Hx    Colon polyps Neg Hx     Social History   Socioeconomic History   Marital status: Single    Spouse name: Not on file   Number of children: Not on file   Years of education: Not on file   Highest education level: Not on file  Occupational History   Not on file  Tobacco Use   Smoking status: Never   Smokeless tobacco: Never  Substance and Sexual Activity   Alcohol use: Not Currently   Drug use: Not Currently    Types: Marijuana   Sexual activity: Not on file  Other Topics Concern   Not on file  Social History Narrative   Not on file   Social Determinants of Health   Financial Resource Strain: Not on file  Food  Insecurity: Not on file  Transportation Needs: Not on file  Physical Activity: Not on file  Stress: Not on file  Social Connections: Not on file  Intimate Partner Violence: Not on file     Review of Systems   Gen: see HPI  CV: Denies chest pain, heart palpitations, peripheral edema, syncope.  Resp: Denies shortness of breath at rest or with exertion. Denies wheezing or cough.  GI: Denies dysphagia or odynophagia. Denies jaundice, hematemesis, fecal incontinence. GU : Denies urinary burning, urinary frequency, urinary hesitancy MS: Denies joint pain, muscle weakness, cramps, or limitation of movement.  Derm: Denies rash, itching, dry skin Psych: Denies depression, anxiety, memory loss, and confusion Heme: Denies bruising, bleeding, and enlarged  lymph nodes.   Physical Exam   BP 119/88   Pulse 84   Temp (!) 97.2 F (36.2 C)   Ht 5' 4.5" (1.638 m)   Wt 183 lb (83 kg)   BMI 30.93 kg/m  General:   Alert and oriented. Pleasant and cooperative. Well-nourished and well-developed.  Head:  Normocephalic and atraumatic. Eyes:  Without icterus Ears:  Normal auditory acuity. Lungs:  Clear to auscultation bilaterally.  Heart:  S1, S2 present without murmurs appreciated.  Abdomen:  +BS, soft, TTP epigastric and non-distended. No HSM noted. No guarding or rebound. No masses appreciated.  Rectal:  Deferred  Msk:  Symmetrical without gross deformities. Normal posture. Extremities:  Without edema. Neurologic:  Alert and  oriented x4;  grossly normal neurologically. Skin:  Intact without significant lesions or rashes. Psych:  Alert and cooperative. Normal mood and affect.   Assessment   Sue Schmidt is a 53 y.o. female presenting today at the request of Ludwig Clarks, FNP due to positive Cologuard test in Oct 2023. She is also noting dyspepsia.   Positive Cologuard: without overt GI bleeding but has noticed more "gelatin-like" stool and mucus. No FH colon cancer or polyps.   Dyspepsia: noting epigastric pain, early satiety, weight loss of approximately 10 lbs since Oct 2023 per her scales. Will pursue EGD at time of colonoscopy. CT if negative.     PLAN   Proceed with colonoscopy/EGD by Dr. Abbey Chatters  in near future: the risks, benefits, and alternatives have been discussed with the patient in detail. The patient states understanding and desires to proceed. ASA 2  Patient will call next week with her schedule availability.    Annitta Needs, PhD, ANP-BC [facility] Gastroenterology

## 2022-09-18 NOTE — H&P (View-Only) (Signed)
Gastroenterology Office Note    Referring Provider: Ludwig Clarks, FNP Primary Care Physician:  Sue Clarks, FNP  Primary GI: Dr. Abbey Schmidt    Chief Complaint   Chief Complaint  Patient presents with   New Patient (Initial Visit)    Positive Cologuard test      History of Present Illness   Sue Schmidt is a 52 y.o. female presenting today at the request of Sue Clarks, FNP due to positive Cologuard test in Oct 2023.    Weight is fluctuating. In October was 195. Now 183. No appetite. Early satiety. No GERD or dysphagia. Will have epigastric pain intermittently. Feels like stabbing pain. No melena or hematochezia. Stool looks like a gelatin for past 4 months. Will have solid BM if eating tacos off the taco truck. Lots of mucus.   No prior colonoscopy. No FH colon cancer or polyps.     Past Medical History:  Diagnosis Date   Hypertension    Thyroid disease     Past Surgical History:  Procedure Laterality Date   ABDOMINAL HYSTERECTOMY     CHOLECYSTECTOMY     CYSTOSCOPY WITH RETROGRADE PYELOGRAM, URETEROSCOPY AND STENT PLACEMENT Right 01/31/2021   Procedure: CYSTOSCOPY WITH RIGHT RETROGRADE PYELOGRAM, RIGHT URETEROSCOPY AND RIGHT URETERAL STENT PLACEMENT;  Surgeon: Cleon Gustin, MD;  Location: [facility];  Service: Urology;  Laterality: Right;   STONE EXTRACTION WITH BASKET Right 01/31/2021   Procedure: STONE EXTRACTION WITH BASKET;  Surgeon: Cleon Gustin, MD;  Location: [facility];  Service: Urology;  Laterality: Right;    Current Outpatient Medications  Medication Sig Dispense Refill   amLODipine (NORVASC) 10 MG tablet Take 1 tablet by mouth daily.     atorvastatin (LIPITOR) 10 MG tablet Take 1 tablet by mouth daily.     Cholecalciferol (VITAMIN D3) 50 MCG (2000 UT) TABS Take 1 tablet by mouth daily.     escitalopram (LEXAPRO) 20 MG tablet Take 1 tablet by mouth daily.     hydrALAZINE (APRESOLINE) 100 MG tablet Take 0.5 tablets (50 mg total) by mouth 2  (two) times daily. For BP 60 tablet 3   levothyroxine (SYNTHROID) 175 MCG tablet Take 175 mcg by mouth daily.     losartan (COZAAR) 100 MG tablet Take 1 tablet by mouth daily.     SUMAtriptan (IMITREX) 100 MG tablet Take 100 mg by mouth as needed for headache.     No current facility-administered medications for this visit.    Allergies as of 09/18/2022 - Review Complete 09/18/2022  Allergen Reaction Noted   Tape Dermatitis 01/31/2021    Family History  Problem Relation Age of Onset   Colon cancer Neg Hx    Colon polyps Neg Hx     Social History   Socioeconomic History   Marital status: Single    Spouse name: Not on file   Number of children: Not on file   Years of education: Not on file   Highest education level: Not on file  Occupational History   Not on file  Tobacco Use   Smoking status: Never   Smokeless tobacco: Never  Substance and Sexual Activity   Alcohol use: Not Currently   Drug use: Not Currently    Types: Marijuana   Sexual activity: Not on file  Other Topics Concern   Not on file  Social History Narrative   Not on file   Social Determinants of Health   Financial Resource Strain: Not on file  Food  Insecurity: Not on file  Transportation Schmidt: Not on file  Physical Activity: Not on file  Stress: Not on file  Social Connections: Not on file  Intimate Partner Violence: Not on file     Review of Systems   Gen: see HPI  CV: Denies chest pain, heart palpitations, peripheral edema, syncope.  Resp: Denies shortness of breath at rest or with exertion. Denies wheezing or cough.  GI: Denies dysphagia or odynophagia. Denies jaundice, hematemesis, fecal incontinence. GU : Denies urinary burning, urinary frequency, urinary hesitancy MS: Denies joint pain, muscle weakness, cramps, or limitation of movement.  Derm: Denies rash, itching, dry skin Psych: Denies depression, anxiety, memory loss, and confusion Heme: Denies bruising, bleeding, and enlarged  lymph nodes.   Physical Exam   BP 119/88   Pulse 84   Temp (!) 97.2 F (36.2 C)   Ht 5' 4.5" (1.638 m)   Wt 183 lb (83 kg)   BMI 30.93 kg/m  General:   Alert and oriented. Pleasant and cooperative. Well-nourished and well-developed.  Head:  Normocephalic and atraumatic. Eyes:  Without icterus Ears:  Normal auditory acuity. Lungs:  Clear to auscultation bilaterally.  Heart:  S1, S2 present without murmurs appreciated.  Abdomen:  +BS, soft, TTP epigastric and non-distended. No HSM noted. No guarding or rebound. No masses appreciated.  Rectal:  Deferred  Msk:  Symmetrical without gross deformities. Normal posture. Extremities:  Without edema. Neurologic:  Alert and  oriented x4;  grossly normal neurologically. Skin:  Intact without significant lesions or rashes. Psych:  Alert and cooperative. Normal mood and affect.   Assessment   Sue Schmidt is a 52 y.o. female presenting today at the request of Sue Clarks, FNP due to positive Cologuard test in Oct 2023. She is also noting dyspepsia.   Positive Cologuard: without overt GI bleeding but has noticed more "gelatin-like" stool and mucus. No FH colon cancer or polyps.   Dyspepsia: noting epigastric pain, early satiety, weight loss of approximately 10 lbs since Oct 2023 per her scales. Will pursue EGD at time of colonoscopy. CT if negative.     PLAN   Proceed with colonoscopy/EGD by Dr. Abbey Schmidt  in near future: the risks, benefits, and alternatives have been discussed with the patient in detail. The patient states understanding and desires to proceed. ASA 2  Patient will call next week with her schedule availability.    Sue Needs, PhD, ANP-BC [facility] Gastroenterology

## 2022-09-22 ENCOUNTER — Ambulatory Visit: Payer: Medicaid Other | Admitting: Gastroenterology

## 2022-09-30 ENCOUNTER — Encounter: Payer: Self-pay | Admitting: *Deleted

## 2022-09-30 MED ORDER — PEG 3350-KCL-NA BICARB-NACL 420 G PO SOLR: 4000.0000 mL | Freq: Once | ORAL | 0 refills | Status: AC

## 2022-10-07 ENCOUNTER — Ambulatory Visit (HOSPITAL_COMMUNITY): Payer: Medicaid Other | Admitting: Anesthesiology

## 2022-10-07 ENCOUNTER — Encounter (HOSPITAL_COMMUNITY): Payer: Self-pay

## 2022-10-07 ENCOUNTER — Encounter (HOSPITAL_COMMUNITY)
Admission: RE | Disposition: A | Discharge status: Home / Self Care | Payer: Self-pay | Source: Home / Self Care | Attending: Internal Medicine

## 2022-10-07 ENCOUNTER — Ambulatory Visit (HOSPITAL_COMMUNITY)
Admission: RE | Admit: 2022-10-07 | Discharge: 2022-10-07 | Disposition: A | Discharge status: Home / Self Care | Payer: Medicaid Other | Attending: Internal Medicine | Admitting: Internal Medicine

## 2022-10-07 ENCOUNTER — Ambulatory Visit (HOSPITAL_BASED_OUTPATIENT_CLINIC_OR_DEPARTMENT_OTHER): Payer: Medicaid Other | Admitting: Anesthesiology

## 2022-10-07 ENCOUNTER — Other Ambulatory Visit: Payer: Self-pay

## 2022-10-07 DIAGNOSIS — D123 Benign neoplasm of transverse colon: Secondary | ICD-10-CM | POA: Diagnosis not present

## 2022-10-07 DIAGNOSIS — R634 Abnormal weight loss: Secondary | ICD-10-CM | POA: Diagnosis not present

## 2022-10-07 DIAGNOSIS — Z6830 Body mass index (BMI) 30.0-30.9, adult: Secondary | ICD-10-CM | POA: Diagnosis not present

## 2022-10-07 DIAGNOSIS — D124 Benign neoplasm of descending colon: Secondary | ICD-10-CM

## 2022-10-07 DIAGNOSIS — Z1211 Encounter for screening for malignant neoplasm of colon: Secondary | ICD-10-CM | POA: Diagnosis not present

## 2022-10-07 DIAGNOSIS — I10 Essential (primary) hypertension: Secondary | ICD-10-CM | POA: Diagnosis not present

## 2022-10-07 DIAGNOSIS — K5730 Diverticulosis of large intestine without perforation or abscess without bleeding: Secondary | ICD-10-CM | POA: Insufficient documentation

## 2022-10-07 DIAGNOSIS — R1013 Epigastric pain: Secondary | ICD-10-CM | POA: Diagnosis present

## 2022-10-07 DIAGNOSIS — K449 Diaphragmatic hernia without obstruction or gangrene: Secondary | ICD-10-CM

## 2022-10-07 DIAGNOSIS — K2970 Gastritis, unspecified, without bleeding: Secondary | ICD-10-CM

## 2022-10-07 DIAGNOSIS — D120 Benign neoplasm of cecum: Secondary | ICD-10-CM

## 2022-10-07 DIAGNOSIS — K649 Unspecified hemorrhoids: Secondary | ICD-10-CM | POA: Diagnosis not present

## 2022-10-07 DIAGNOSIS — R195 Other fecal abnormalities: Secondary | ICD-10-CM | POA: Diagnosis not present

## 2022-10-07 DIAGNOSIS — D122 Benign neoplasm of ascending colon: Secondary | ICD-10-CM

## 2022-10-07 DIAGNOSIS — R6881 Early satiety: Secondary | ICD-10-CM | POA: Diagnosis not present

## 2022-10-07 DIAGNOSIS — Z79899 Other long term (current) drug therapy: Secondary | ICD-10-CM | POA: Diagnosis not present

## 2022-10-07 HISTORY — PX: POLYPECTOMY: SHX5525

## 2022-10-07 HISTORY — PX: ESOPHAGOGASTRODUODENOSCOPY (EGD) WITH PROPOFOL: SHX5813

## 2022-10-07 HISTORY — PX: BIOPSY: SHX5522

## 2022-10-07 HISTORY — PX: COLONOSCOPY WITH PROPOFOL: SHX5780

## 2022-10-07 SURGERY — COLONOSCOPY WITH PROPOFOL
Anesthesia: General

## 2022-10-07 MED ORDER — STERILE WATER FOR IRRIGATION IR SOLN
Status: DC | PRN
  Administered 2022-10-07: 120 mL

## 2022-10-07 MED ORDER — PROPOFOL 10 MG/ML IV BOLUS
INTRAVENOUS | Status: DC | PRN
  Administered 2022-10-07 (×2): 50 mg via INTRAVENOUS

## 2022-10-07 MED ORDER — PANTOPRAZOLE SODIUM 40 MG PO TBEC: 40.0000 mg | DELAYED_RELEASE_TABLET | Freq: Every day | ORAL | 11 refills | Status: DC

## 2022-10-07 MED ORDER — PROPOFOL 500 MG/50ML IV EMUL
INTRAVENOUS | Status: DC | PRN
  Administered 2022-10-07: 200 ug/kg/min via INTRAVENOUS

## 2022-10-07 MED ORDER — LACTATED RINGERS IV SOLN: INTRAVENOUS | Status: DC

## 2022-10-07 NOTE — Anesthesia Preprocedure Evaluation (Addendum)
Anesthesia Evaluation  Patient identified by MRN, date of birth, ID band Patient awake    Reviewed: Allergy & Precautions, H&P , NPO status , Patient's Chart, lab work & pertinent test results  Airway Mallampati: II  TM Distance: >3 FB Neck ROM: Full    Dental no notable dental hx. (+) Dental Advisory Given, Missing   Pulmonary neg pulmonary ROS   Pulmonary exam normal breath sounds clear to auscultation       Cardiovascular hypertension, Pt. on medications Normal cardiovascular exam Rhythm:Regular Rate:Normal     Neuro/Psych negative neurological ROS  negative psych ROS   GI/Hepatic Neg liver ROS,neg GERD  ,,  Endo/Other  negative endocrine ROS    Renal/GU Renal InsufficiencyRenal disease  negative genitourinary   Musculoskeletal negative musculoskeletal ROS (+)    Abdominal   Peds negative pediatric ROS (+)  Hematology negative hematology ROS (+)   Anesthesia Other Findings Abdominal pain  Reproductive/Obstetrics negative OB ROS                             Anesthesia Physical Anesthesia Plan  ASA: 2  Anesthesia Plan: General   Post-op Pain Management: Minimal or no pain anticipated   Induction: Intravenous  PONV Risk Score and Plan: 1 and Propofol infusion  Airway Management Planned: Nasal Cannula and Natural Airway  Additional Equipment:   Intra-op Plan:   Post-operative Plan:   Informed Consent: I have reviewed the patients History and Physical, chart, labs and discussed the procedure including the risks, benefits and alternatives for the proposed anesthesia with the patient or authorized representative who has indicated his/her understanding and acceptance.     Dental advisory given  Plan Discussed with: CRNA and Surgeon  Anesthesia Plan Comments:        Anesthesia Quick Evaluation

## 2022-10-07 NOTE — Anesthesia Postprocedure Evaluation (Signed)
Anesthesia Post Note  Patient: Occupational hygienist  Procedure(s) Performed: COLONOSCOPY WITH PROPOFOL ESOPHAGOGASTRODUODENOSCOPY (EGD) WITH PROPOFOL BIOPSY POLYPECTOMY  Patient location during evaluation: Phase II Anesthesia Type: General Level of consciousness: awake and alert and oriented Pain management: pain level controlled Vital Signs Assessment: post-procedure vital signs reviewed and stable Respiratory status: spontaneous breathing, nonlabored ventilation and respiratory function stable Cardiovascular status: blood pressure returned to baseline and stable Postop Assessment: no apparent nausea or vomiting Anesthetic complications: no  No notable events documented.   Last Vitals:  Vitals:   10/07/22 0930 10/07/22 1026  BP: (!) 144/91 115/75  Pulse: 90 98  Resp: 15   Temp: 36.6 C 36.4 C  SpO2: 100% 100%    Last Pain:  Vitals:   10/07/22 1026  TempSrc: Oral  PainSc: 0-No pain                 Siedah Sedor C Annitta Fifield

## 2022-10-07 NOTE — Discharge Instructions (Addendum)
  Colonoscopy Discharge Instructions  Read the instructions outlined below and refer to this sheet in the next few weeks. These discharge instructions provide you with general information on caring for yourself after you leave the hospital. Your doctor may also give you specific instructions. While your treatment has been planned according to the most current medical practices available, unavoidable complications occasionally occur.   ACTIVITY You may resume your regular activity, but move at a slower pace for the next 24 hours.  Take frequent rest periods for the next 24 hours.  Walking will help get rid of the air and reduce the bloated feeling in your belly (abdomen).  No driving for 24 hours (because of the medicine (anesthesia) used during the test).   Do not sign any important legal documents or operate any machinery for 24 hours (because of the anesthesia used during the test).  NUTRITION Drink plenty of fluids.  You may resume your normal diet as instructed by your doctor.  Begin with a light meal and progress to your normal diet. Heavy or fried foods are harder to digest and may make you feel sick to your stomach (nauseated).  Avoid alcoholic beverages for 24 hours or as instructed.  MEDICATIONS You may resume your normal medications unless your doctor tells you otherwise.  WHAT YOU CAN EXPECT TODAY Some feelings of bloating in the abdomen.  Passage of more gas than usual.  Spotting of blood in your stool or on the toilet paper.  IF YOU HAD POLYPS REMOVED DURING THE COLONOSCOPY: No aspirin products for 7 days or as instructed.  No alcohol for 7 days or as instructed.  Eat a soft diet for the next 24 hours.  FINDING OUT THE RESULTS OF YOUR TEST Not all test results are available during your visit. If your test results are not back during the visit, make an appointment with your caregiver to find out the results. Do not assume everything is normal if you have not heard from your  caregiver or the medical facility. It is important for you to follow up on all of your test results.  SEEK IMMEDIATE MEDICAL ATTENTION IF: You have more than a spotting of blood in your stool.  Your belly is swollen (abdominal distention).  You are nauseated or vomiting.  You have a temperature over 101.  You have abdominal pain or discomfort that is severe or gets worse throughout the day.   Your EGD revealed mild amount inflammation in your stomach.  I took biopsies of this to rule out infection with a bacteria called H. pylori.  Await pathology results, my office will contact you.  Esophagus and small bowel appeared normal.   I am going to start you on a new medication called pantoprazole 40 mg daily. This medication works best if you take it 30 min before meals.   Your colonoscopy revealed 7 polyp(s) which I removed successfully. Await pathology results, my office will contact you. I recommend repeating colonoscopy in 3 years for surveillance purposes.   You also have diverticulosis and internal hemorrhoids. I would recommend increasing fiber in your diet or adding OTC Benefiber/Metamucil. Be sure to drink at least 4 to 6 glasses of water daily. Follow-up with GI in 3 months   I hope you have a great rest of your week!  Sue Schmidt. Abbey Chatters, D.O. Gastroenterology and Hepatology [facility]

## 2022-10-07 NOTE — Op Note (Signed)
[facility] Patient Name: Sue Schmidt Procedure Date: 10/07/2022 9:27 AM MRN: [id number] Date of Birth: [birth year] Attending MD: Elon Alas. Abbey Chatters , [place], [id number] CSN: [id number] Age: 52 Admit Type: Outpatient Procedure:                Upper GI endoscopy Indications:              Epigastric abdominal pain Providers:                Elon Alas. Abbey Chatters, DO, Lambert Mody, Dereck Leep, Technician Referring MD:              Medicines:                See the Anesthesia note for documentation of the                            administered medications Complications:            No immediate complications. Estimated Blood Loss:     Estimated blood loss was minimal. Procedure:                Pre-Anesthesia Assessment:                           - The anesthesia plan was to use monitored                            anesthesia care (MAC).                           After obtaining informed consent, the endoscope was                            passed under direct vision. Throughout the                            procedure, the patient's blood pressure, pulse, and                            oxygen saturations were monitored continuously. The                            [id number]-H190 ([id number]) scope was introduced through the                            mouth, and advanced to the second part of duodenum.                            The upper GI endoscopy was accomplished without                            difficulty. The patient tolerated the procedure                            well. Scope In:  9:56:41 AM Scope Out: 9:59:34 AM Total Procedure Duration: 0 hours 2 minutes 53 seconds  Findings:      A small hiatal hernia was present.      The Z-line was regular and was found 37 cm from the incisors.      There is no endoscopic evidence of areas of erosion, esophagitis or       ulcerations in the entire esophagus.      Patchy mild inflammation characterized by  erythema was found in the       gastric body and in the gastric antrum. Biopsies were taken with a cold       forceps for Helicobacter pylori testing.      The duodenal bulb, first portion of the duodenum and second portion of       the duodenum were normal. Impression:               - Small hiatal hernia.                           - Z-line regular, 37 cm from the incisors.                           - Gastritis. Biopsied.                           - Normal duodenal bulb, first portion of the                            duodenum and second portion of the duodenum. Moderate Sedation:      Per Anesthesia Care Recommendation:           - Patient has a contact number available for                            emergencies. The signs and symptoms of potential                            delayed complications were discussed with the                            patient. Return to normal activities tomorrow.                            Written discharge instructions were provided to the                            patient.                           - Resume previous diet.                           - Continue present medications.                           - Await pathology results.                           -  Use Protonix (pantoprazole) 40 mg PO daily.                           - Return to GI clinic in 3 months. Procedure Code(s):        --- Professional ---                           [phone], Esophagogastroduodenoscopy, flexible,                            transoral; with biopsy, single or multiple Diagnosis Code(s):        --- Professional ---                           K44.9, Diaphragmatic hernia without obstruction or                            gangrene                           K29.70, Gastritis, unspecified, without bleeding                           R10.13, Epigastric pain CPT copyright 2022 American Medical Association. All rights reserved. The codes documented in this report are preliminary and  upon coder review may  be revised to meet current compliance requirements. Elon Alas. Abbey Chatters, DO [place] Abbey Chatters, DO 10/07/2022 10:01:44 AM This report has been signed electronically. Number of Addenda: 0

## 2022-10-07 NOTE — Interval H&P Note (Signed)
History and Physical Interval Note:  10/07/2022 9:43 AM  Sue Schmidt  has presented today for surgery, with the diagnosis of positive cologuard,dyspepsia.  The various methods of treatment have been discussed with the patient and family. After consideration of risks, benefits and other options for treatment, the patient has consented to  Procedure(s) with comments: COLONOSCOPY WITH PROPOFOL (N/A) - 1:15 pm ESOPHAGOGASTRODUODENOSCOPY (EGD) WITH PROPOFOL (N/A) as a surgical intervention.  The patient's history has been reviewed, patient examined, no change in status, stable for surgery.  I have reviewed the patient's chart and labs.  Questions were answered to the patient's satisfaction.     Eloise Harman

## 2022-10-07 NOTE — Transfer of Care (Signed)
Immediate Anesthesia Transfer of Care Note  Patient: Occupational hygienist  Procedure(s) Performed: COLONOSCOPY WITH PROPOFOL ESOPHAGOGASTRODUODENOSCOPY (EGD) WITH PROPOFOL BIOPSY POLYPECTOMY  Patient Location: PACU  Anesthesia Type:General  Level of Consciousness: awake, alert , and oriented  Airway & Oxygen Therapy: Patient Spontanous Breathing  Post-op Assessment: Report given to RN and Post -op Vital signs reviewed and stable  Post vital signs: Reviewed and stable  Last Vitals:  Vitals Value Taken Time  BP    Temp    Pulse    Resp    SpO2      Last Pain:  Vitals:   10/07/22 0951  TempSrc:   PainSc: 3       Patients Stated Pain Goal: 3 (47/65/46 5035)  Complications: No notable events documented.

## 2022-10-07 NOTE — Op Note (Signed)
[facility] Patient Name: Sue Schmidt Procedure Date: 10/07/2022 10:02 AM MRN: [id number] Date of Birth: [birth year] Attending MD: Elon Alas. Abbey Chatters , [place], [id number] CSN: [id number] Age: 52 Admit Type: Outpatient Procedure:                Colonoscopy Indications:              Positive Cologuard test Providers:                Elon Alas. Abbey Chatters, DO, Lambert Mody, Dereck Leep, Technician Referring MD:              Medicines:                See the Anesthesia note for documentation of the                            administered medications Complications:            No immediate complications. Estimated Blood Loss:     Estimated blood loss was minimal. Procedure:                Pre-Anesthesia Assessment:                           - The anesthesia plan was to use monitored                            anesthesia care (MAC).                           After obtaining informed consent, the colonoscope                            was passed under direct vision. Throughout the                            procedure, the patient's blood pressure, pulse, and                            oxygen saturations were monitored continuously. The                            [id number]-HQ190L ([id number]) scope was introduced through                            the anus and advanced to the the cecum, identified                            by appendiceal orifice and ileocecal valve. The                            colonoscopy was performed without difficulty. The                            patient tolerated the procedure well. The  quality                            of the bowel preparation was evaluated using the                            BBPS [facility] Bowel Preparation Scale) with scores                            of: Right Colon = 3, Transverse Colon = 3 and Left                            Colon = 3 (entire mucosa seen well with no residual                            staining,  small fragments of stool or opaque                            liquid). The total BBPS score equals 9. Scope In: 10:04:03 AM Scope Out: 10:20:31 AM Scope Withdrawal Time: 0 hours 14 minutes 33 seconds  Total Procedure Duration: 0 hours 16 minutes 28 seconds  Findings:      Hemorrhoids were found on perianal exam.      A few small-mouthed diverticula were found in the sigmoid colon.      Two sessile polyps were found in the ascending colon and cecum. The       polyps were 4 to 6 mm in size. These polyps were removed with a cold       snare. Resection and retrieval were complete.      Five sessile polyps were found in the descending colon and transverse       colon. The polyps were 8 to 10 mm in size. These polyps were removed       with a cold snare. Resection and retrieval were complete.      The exam was otherwise without abnormality. Impression:               - Hemorrhoids found on perianal exam.                           - Diverticulosis in the sigmoid colon.                           - Two 4 to 6 mm polyps in the ascending colon and                            in the cecum, removed with a cold snare. Resected                            and retrieved.                           - Five 8 to 10 mm polyps in the descending colon  and in the transverse colon, removed with a cold                            snare. Resected and retrieved.                           - The examination was otherwise normal. Moderate Sedation:      Per Anesthesia Care Recommendation:           - Patient has a contact number available for                            emergencies. The signs and symptoms of potential                            delayed complications were discussed with the                            patient. Return to normal activities tomorrow.                            Written discharge instructions were provided to the                            patient.                            - Resume previous diet.                           - Continue present medications.                           - Await pathology results.                           - Repeat colonoscopy in 3 years for surveillance.                           - Return to GI clinic in 3 months. Procedure Code(s):        --- Professional ---                           [phone], Colonoscopy, flexible; with removal of                            tumor(s), polyp(s), or other lesion(s) by snare                            technique Diagnosis Code(s):        --- Professional ---                           K64.9, Unspecified hemorrhoids                           D12.2, Benign neoplasm of ascending  colon                           D12.0, Benign neoplasm of cecum                           D12.4, Benign neoplasm of descending colon                           D12.3, Benign neoplasm of transverse colon (hepatic                            flexure or splenic flexure)                           R19.5, Other fecal abnormalities                           K57.30, Diverticulosis of large intestine without                            perforation or abscess without bleeding CPT copyright 2022 American Medical Association. All rights reserved. The codes documented in this report are preliminary and upon coder review may  be revised to meet current compliance requirements. Elon Alas. Abbey Chatters, DO [place] Abbey Chatters, DO 10/07/2022 10:24:01 AM This report has been signed electronically. Number of Addenda: 0

## 2022-10-08 LAB — SURGICAL PATHOLOGY

## 2022-10-13 ENCOUNTER — Encounter (HOSPITAL_COMMUNITY): Payer: Self-pay | Admitting: Internal Medicine

## 2022-10-16 ENCOUNTER — Telehealth: Payer: Self-pay

## 2022-10-16 NOTE — Telephone Encounter (Signed)
Patient called advising she is having left flank pain. She has a hx of kidney stones. She advised that she had a procedure done and the hospital advised her kidney function was low. I advised patient you were booked out but I would see if you wanted to order a CT and go from there.

## 2022-10-27 ENCOUNTER — Other Ambulatory Visit: Payer: Self-pay

## 2022-10-27 DIAGNOSIS — R1032 Left lower quadrant pain: Secondary | ICD-10-CM

## 2022-10-27 NOTE — Telephone Encounter (Signed)
Orders in 

## 2022-10-27 NOTE — Telephone Encounter (Signed)
Made Patient aware that the order for a CT stone study is in and have to be approve through insurance. Once an approval for the CT stone study is complete someone will reach out to her on the next steps. Patient voiced understanding.

## 2022-10-27 NOTE — Progress Notes (Signed)
Verbal from MD to order CT stone study.

## 2022-10-29 ENCOUNTER — Ambulatory Visit (HOSPITAL_COMMUNITY)
Admission: RE | Admit: 2022-10-29 | Discharge: 2022-10-29 | Disposition: A | Discharge status: Home / Self Care | Payer: Medicaid Other | Source: Ambulatory Visit | Attending: Urology | Admitting: Urology

## 2022-10-29 DIAGNOSIS — R1032 Left lower quadrant pain: Secondary | ICD-10-CM | POA: Insufficient documentation

## 2022-11-11 ENCOUNTER — Other Ambulatory Visit (HOSPITAL_COMMUNITY): Payer: Self-pay | Admitting: Family Medicine

## 2022-11-11 DIAGNOSIS — R102 Pelvic and perineal pain: Secondary | ICD-10-CM

## 2022-11-19 ENCOUNTER — Encounter: Payer: Self-pay | Admitting: Gastroenterology

## 2022-11-21 ENCOUNTER — Ambulatory Visit (HOSPITAL_COMMUNITY)
Admission: RE | Admit: 2022-11-21 | Discharge: 2022-11-21 | Disposition: A | Discharge status: Home / Self Care | Payer: Medicaid Other | Source: Ambulatory Visit | Attending: Family Medicine | Admitting: Family Medicine

## 2022-11-21 DIAGNOSIS — R102 Pelvic and perineal pain: Secondary | ICD-10-CM | POA: Diagnosis present

## 2022-11-26 ENCOUNTER — Telehealth: Payer: Self-pay

## 2022-11-26 NOTE — Telephone Encounter (Signed)
Left message on voice mail that I was calling about a referral we had received for her.

## 2022-11-28 ENCOUNTER — Ambulatory Visit: Payer: Medicaid Other | Admitting: Obstetrics & Gynecology

## 2022-11-28 ENCOUNTER — Encounter: Payer: Self-pay | Admitting: Obstetrics & Gynecology

## 2022-11-28 VITALS — BP 135/77 | HR 77 | Ht 64.0 in | Wt 168.0 lb

## 2022-11-28 DIAGNOSIS — N838 Other noninflammatory disorders of ovary, fallopian tube and broad ligament: Secondary | ICD-10-CM

## 2022-11-28 NOTE — Progress Notes (Signed)
Follow up appointment for results: Left ovarian mass  Chief Complaint  Patient presents with   ovarian mass    Left sided pain    Blood pressure 135/77, pulse 77, height 5\' 4"  (1.626 m), weight 168 lb (76.2 kg).  LLQ pain 2 months LAVH 2016 or 2017  Supposedly right ovary is there  US PELVIC COMPLETE WITH TRANSVAGINAL  Result Date: 11/22/2022 CLINICAL DATA:  Pelvic and perineal pain for 2-3 weeks. Pain is worse on the LEFT. Prior hysterectomy. EXAM: TRANSABDOMINAL AND TRANSVAGINAL ULTRASOUND OF PELVIS TECHNIQUE: Both transabdominal and transvaginal ultrasound examinations of the pelvis were performed. Transabdominal technique was performed for global imaging of the pelvis including uterus, ovaries, adnexal regions, and pelvic cul-de-sac. It was necessary to proceed with endovaginal exam following the transabdominal exam to visualize the adnexal regions. COMPARISON:  CT of the abdomen and pelvis on 10/29/2022 FINDINGS: Uterus Prior hysterectomy.  The vaginal cuff is unremarkable. Right ovary Not visualized.  No adnexal mass identified. Left ovary Measurements: 4.7 x 3.3 x 5.5 centimeters = volume: 44.0 mL. Within the ovary, there is a heterogeneous mass measuring 3.5 x 2.7 x 3.4 centimeters. Additionally, there are adjacent cystic components, measuring up to 1.9 centimeters. Other findings No abnormal free fluid. IMPRESSION: 1. Prior hysterectomy. 2. Complex solid and cystic mass in the LEFT ovary. Mass is indeterminate. Recommend further evaluation with MRI and surgical evaluation. Otherwise, pelvic ultrasound would be suggested in 8-12 weeks. 3. Nonvisualized RIGHT ovary. Electronically Signed   By: Nolon Nations M.D.   On: 11/22/2022 15:58   CT RENAL STONE STUDY  Result Date: 10/29/2022 CLINICAL DATA:  Left-sided abdominal pain. History of kidney stones. EXAM: CT ABDOMEN AND PELVIS WITHOUT CONTRAST TECHNIQUE: Multidetector CT imaging of the abdomen and pelvis was performed following the  standard protocol without IV contrast. RADIATION DOSE REDUCTION: This exam was performed according to the departmental dose-optimization program which includes automated exposure control, adjustment of the mA and/or kV according to patient size and/or use of iterative reconstruction technique. COMPARISON:  01/30/2021 FINDINGS: Lower chest: Unremarkable. Hepatobiliary: Tiny hypodensity in the lateral segment left liver is too small to characterize but stable in the interval consistent with benign etiology. No followup imaging is recommended. Gallbladder is surgically absent. No intrahepatic or extrahepatic biliary dilation. Pancreas: No focal mass lesion. No dilatation of the main duct. No intraparenchymal cyst. No peripancreatic edema. Spleen: No splenomegaly. No focal mass lesion. Adrenals/Urinary Tract: No adrenal nodule or mass. Kidneys unremarkable. No evidence for hydroureter. The urinary bladder appears normal for the degree of distention. Stomach/Bowel: Stomach is unremarkable. No gastric wall thickening. No evidence of outlet obstruction. Duodenum is normally positioned as is the ligament of Treitz. No small bowel wall thickening. No small bowel dilatation. The terminal ileum is normal. The appendix is normal. No gross colonic mass. No colonic wall thickening. Vascular/Lymphatic: No abdominal aortic aneurysm. There is no gastrohepatic or hepatoduodenal ligament lymphadenopathy. No retroperitoneal or mesenteric lymphadenopathy. No pelvic sidewall lymphadenopathy. Reproductive: Hysterectomy. There is a new 4.2 x 3.4 cm multicystic lesion identified in the left adnexal space, limited assessment and not adequately characterized on this noncontrast study. Other: No intraperitoneal free fluid. Musculoskeletal: No worrisome lytic or sclerotic osseous abnormality. IMPRESSION: 1. New 4.2 x 3.4 cm multicystic lesion in the left adnexal space, limited assessment and not adequately characterized on this noncontrast  study. Recommend prompt follow-up with pelvic ultrasound. Reference: JACR 2020 Feb;17(2):. 2. Otherwise, acute findings in the abdomen or pelvis. Specifically, no evidence for urinary stone  disease. Electronically Signed   By: Misty Stanley M.D.   On: 10/29/2022 16:38       MEDS ordered this encounter: No orders of the defined types were placed in this encounter.   Orders for this encounter: Orders Placed This Encounter  Procedures   Ovarian Malignancy Risk-ROMA    Impression + Management Plan   ICD-10-CM   1. Ovarian mass, left  N83.8 Ovarian Malignancy Risk-ROMA   ROMA today, plan on robotic BSO if normal      Follow Up: Return if symptoms worsen or fail to improve.     All questions were answered.  Past Medical History:  Diagnosis Date   Anxiety and depression    Hypertension    Kidney stones    Mental disorder    Migraines    Thyroid disease    Vitamin D deficiency     Past Surgical History:  Procedure Laterality Date   ABDOMINAL HYSTERECTOMY     BIOPSY  10/07/2022   Procedure: BIOPSY;  Surgeon: Eloise Harman, DO;  Location: [facility];  Service: Endoscopy;;   CHOLECYSTECTOMY     COLONOSCOPY WITH PROPOFOL N/A 10/07/2022   Procedure: COLONOSCOPY WITH PROPOFOL;  Surgeon: Eloise Harman, DO;  Location: [facility];  Service: Endoscopy;  Laterality: N/A;  1:15 pm   CYSTOSCOPY WITH RETROGRADE PYELOGRAM, URETEROSCOPY AND STENT PLACEMENT Right 01/31/2021   Procedure: CYSTOSCOPY WITH RIGHT RETROGRADE PYELOGRAM, RIGHT URETEROSCOPY AND RIGHT URETERAL STENT PLACEMENT;  Surgeon: Cleon Gustin, MD;  Location: [facility];  Service: Urology;  Laterality: Right;   ESOPHAGOGASTRODUODENOSCOPY (EGD) WITH PROPOFOL N/A 10/07/2022   Procedure: ESOPHAGOGASTRODUODENOSCOPY (EGD) WITH PROPOFOL;  Surgeon: Eloise Harman, DO;  Location: [facility];  Service: Endoscopy;  Laterality: N/A;   POLYPECTOMY  10/07/2022   Procedure: POLYPECTOMY;  Surgeon: Eloise Harman, DO;   Location: [facility];  Service: Endoscopy;;   STONE EXTRACTION WITH BASKET Right 01/31/2021   Procedure: STONE EXTRACTION WITH BASKET;  Surgeon: Cleon Gustin, MD;  Location: [facility];  Service: Urology;  Laterality: Right;    OB History     Gravida  2   Para      Term      Preterm      AB      Living         SAB      IAB      Ectopic      Multiple      Live Births              Allergies  Allergen Reactions   Shellfish Allergy    Tape Dermatitis    Patient describes a "burn" type reaction when tape is removed    Social History   Socioeconomic History   Marital status: Single    Spouse name: Not on file   Number of children: Not on file   Years of education: Not on file   Highest education level: Not on file  Occupational History   Not on file  Tobacco Use   Smoking status: Never   Smokeless tobacco: Never  Vaping Use   Vaping Use: Never used  Substance and Sexual Activity   Alcohol use: Not Currently   Drug use: Not Currently    Types: Marijuana   Sexual activity: Yes    Birth control/protection: Surgical    Comment: hyst  Other Topics Concern   Not on file  Social History Narrative   Not on file  Social Determinants of Health   Financial Resource Strain: Not on file  Food Insecurity: Not on file  Transportation Needs: Not on file  Physical Activity: Not on file  Stress: Not on file  Social Connections: Not on file    Family History  Problem Relation Age of Onset   Ovarian cancer Paternal Grandmother    Hypertension Mother    Ovarian cancer Maternal Aunt    Colon cancer Neg Hx    Colon polyps Neg Hx

## 2022-11-29 LAB — POSTMENOPAUSAL INTERP: HIGH

## 2022-11-29 LAB — PREMENOPAUSAL INTERP: HIGH

## 2022-11-29 LAB — OVARIAN MALIGNANCY RISK-ROMA
Cancer Antigen (CA) 125: 14.5 U/mL (ref 0.0–38.1)
HE4: 216 pmol/L — ABNORMAL HIGH (ref 0.0–105.2)
Postmenopausal ROMA: 3.68 — ABNORMAL HIGH
Premenopausal ROMA: 7.23 — ABNORMAL HIGH

## 2022-12-01 ENCOUNTER — Telehealth: Payer: Self-pay

## 2022-12-01 NOTE — Telephone Encounter (Signed)
Patient would like for a nurse to call her.  Would not say what it was about.

## 2022-12-01 NOTE — Telephone Encounter (Signed)
Patient called requesting Dr Elonda Husky to get in touch with her regarding her recent labwork.  States she is a lot of pain and wants her ovary removed. Informed Dr Elonda Husky is out of the office today but will be in the office tomorrow.  Will send message for him to get in touch with her then. Pt verbalized understanding and agreeable to plan.

## 2022-12-02 ENCOUNTER — Encounter: Payer: Self-pay | Admitting: Obstetrics & Gynecology

## 2022-12-03 ENCOUNTER — Other Ambulatory Visit: Payer: Self-pay | Admitting: *Deleted

## 2022-12-03 DIAGNOSIS — N838 Other noninflammatory disorders of ovary, fallopian tube and broad ligament: Secondary | ICD-10-CM

## 2022-12-05 ENCOUNTER — Telehealth: Payer: Self-pay | Admitting: *Deleted

## 2022-12-05 NOTE — Telephone Encounter (Signed)
LMOM for the patient to call the office back. Patient needs to be scheduled for a new patient appt on either 3/28, 3/29, 4/1 or 4/5

## 2022-12-09 NOTE — Telephone Encounter (Signed)
Spoke with the patient, declined referral to our office. Message sent to Dr Elonda Husky

## 2022-12-22 ENCOUNTER — Telehealth: Payer: Self-pay

## 2022-12-22 NOTE — Telephone Encounter (Signed)
Spoke with Ms.Dolliver regarding her referral to GYN oncology. She has an appointment scheduled with Dr. Alvester Morin on 4/29 at 10:15. Patient agrees to date and time. She has been provided with office address and location. She is also aware of our mask and visitor policy. Patient verbalized understanding and will call with any questions.

## 2022-12-27 ENCOUNTER — Other Ambulatory Visit: Payer: Self-pay

## 2022-12-27 ENCOUNTER — Emergency Department (HOSPITAL_COMMUNITY)
Admission: EM | Admit: 2022-12-27 | Discharge: 2022-12-27 | Disposition: A | Discharge status: Home / Self Care | Payer: Medicaid Other | Attending: Emergency Medicine | Admitting: Emergency Medicine

## 2022-12-27 DIAGNOSIS — R944 Abnormal results of kidney function studies: Secondary | ICD-10-CM | POA: Diagnosis not present

## 2022-12-27 DIAGNOSIS — Z79899 Other long term (current) drug therapy: Secondary | ICD-10-CM | POA: Diagnosis not present

## 2022-12-27 DIAGNOSIS — N3000 Acute cystitis without hematuria: Secondary | ICD-10-CM | POA: Insufficient documentation

## 2022-12-27 DIAGNOSIS — R339 Retention of urine, unspecified: Secondary | ICD-10-CM | POA: Diagnosis present

## 2022-12-27 DIAGNOSIS — R110 Nausea: Secondary | ICD-10-CM | POA: Diagnosis not present

## 2022-12-27 LAB — COMPREHENSIVE METABOLIC PANEL
ALT: 15 U/L (ref 0–44)
AST: 15 U/L (ref 15–41)
Albumin: 3.8 g/dL (ref 3.5–5.0)
Alkaline Phosphatase: 66 U/L (ref 38–126)
Anion gap: 13 (ref 5–15)
BUN: 19 mg/dL (ref 6–20)
CO2: 25 mmol/L (ref 22–32)
Calcium: 9 mg/dL (ref 8.9–10.3)
Chloride: 100 mmol/L (ref 98–111)
Creatinine, Ser: 1.16 mg/dL — ABNORMAL HIGH (ref 0.44–1.00)
GFR, Estimated: 57 mL/min — ABNORMAL LOW (ref >60)
Glucose, Bld: 82 mg/dL (ref 70–99)
Potassium: 4.1 mmol/L (ref 3.5–5.1)
Sodium: 138 mmol/L (ref 135–145)
Total Bilirubin: 0.5 mg/dL (ref 0.3–1.2)
Total Protein: 6.6 g/dL (ref 6.5–8.1)

## 2022-12-27 LAB — URINALYSIS, ROUTINE W REFLEX MICROSCOPIC
Bilirubin Urine: NEGATIVE
Glucose, UA: NEGATIVE mg/dL
Hgb urine dipstick: NEGATIVE
Ketones, ur: NEGATIVE mg/dL
Nitrite: NEGATIVE
Protein, ur: 30 mg/dL — AB
Specific Gravity, Urine: 1.027 (ref 1.005–1.030)
pH: 5 (ref 5.0–8.0)

## 2022-12-27 MED ORDER — CEPHALEXIN 500 MG PO CAPS: 500.0000 mg | ORAL_CAPSULE | Freq: Once | ORAL | Status: DC

## 2022-12-27 MED ORDER — OXYCODONE HCL 5 MG PO TABS
10.0000 mg | ORAL_TABLET | Freq: Once | ORAL | Status: AC
  Administered 2022-12-27: 10 mg via ORAL
  Filled 2022-12-27: qty 2

## 2022-12-27 MED ORDER — CEPHALEXIN 500 MG PO CAPS: 500.0000 mg | ORAL_CAPSULE | Freq: Two times a day (BID) | ORAL | 0 refills | Status: DC

## 2022-12-27 NOTE — Discharge Instructions (Addendum)
It was a pleasure taking care of you today. I am sending you home with a paper prescription so that you can pick up your antibiotics at any pharmacy that is open. I am also giving you one dose of the antibiotic since the pharmacies are probably closed for the day already. Seek emergency care if experiencing new or worsening symptoms.

## 2022-12-27 NOTE — ED Triage Notes (Signed)
Patient states for several days she has had difficulty urinating. She was recently dx with a mass, which she feels like has contributed to her symptoms. NAD

## 2022-12-27 NOTE — ED Provider Notes (Signed)
[place] EMERGENCY DEPARTMENT AT [facility] Provider Note   CSN: [id number] Arrival date & time: 12/27/22  1513     History  Chief Complaint  Patient presents with   Urinary Retention    Sue Schmidt is a 52 y.o. female who presents to ED complaining of urinary retention/frequency and abdominal pain over the past week. Also endorses mild intermittent nausea. Patient had left ovarian mass found on imaging 10/29/2022 but has not yet been to oncologist for follow up. Past history of hysterectomy. Denies fever, chills, chest pain, dyspnea, vomiting, dysuria, hematuria  HPI     Home Medications Prior to Admission medications   Medication Sig Start Date End Date Taking? Authorizing Provider  cephALEXin (KEFLEX) 500 MG capsule Take 1 capsule (500 mg total) by mouth 2 (two) times daily. 12/27/22  Yes Valrie Hart F, PA-C  amLODipine (NORVASC) 10 MG tablet Take 10 mg by mouth daily. 01/10/21   [provider]  levothyroxine (SYNTHROID) 175 MCG tablet Take 175 mcg by mouth daily. 01/11/21   [provider]  losartan (COZAAR) 100 MG tablet Take 100 mg by mouth daily. 01/10/21   [provider]  SUMAtriptan (IMITREX) 100 MG tablet Take 100 mg by mouth as needed for headache. 10/10/20   [provider]      Allergies    Shellfish allergy and Tape    Review of Systems   Review of Systems  Physical Exam Updated Vital Signs BP (!) 155/116 (BP Location: Right Arm)   Pulse 85   Temp 97.6 F (36.4 C) (Oral)   Resp 18   Ht 5\' 4"  (1.626 m)   Wt 73.6 kg   SpO2 100%   BMI 27.84 kg/m  Physical Exam Vitals and nursing note reviewed.  Constitutional:      General: She is not in acute distress.    Appearance: Normal appearance. She is not ill-appearing or toxic-appearing.  HENT:     Head: Normocephalic and atraumatic.     Mouth/Throat:     Mouth: Mucous membranes are moist.     Pharynx: No oropharyngeal exudate or posterior oropharyngeal  erythema.  Eyes:     General: No scleral icterus.       Right eye: No discharge.        Left eye: No discharge.     Conjunctiva/sclera: Conjunctivae normal.  Cardiovascular:     Rate and Rhythm: Normal rate.     Pulses: Normal pulses.     Heart sounds: No murmur heard. Pulmonary:     Effort: Pulmonary effort is normal. No respiratory distress.     Breath sounds: No wheezing, rhonchi or rales.  Abdominal:     General: Abdomen is flat. Bowel sounds are normal. There is no distension.     Palpations: Abdomen is soft.     Tenderness: There is abdominal tenderness. There is no right CVA tenderness or left CVA tenderness.     Comments: Tenderness to palpation of lower abdominal quadrants.   Musculoskeletal:     Right lower leg: No edema.     Left lower leg: No edema.  Skin:    General: Skin is warm and dry.     Findings: No rash.  Neurological:     General: No focal deficit present.     Mental Status: She is alert. Mental status is at baseline.  Psychiatric:        Mood and Affect: Mood normal.        Behavior: Behavior  normal.     ED Results / Procedures / Treatments   Labs (all labs ordered are listed, but only abnormal results are displayed) Labs Reviewed  COMPREHENSIVE METABOLIC PANEL - Abnormal; Notable for the following components:      Result Value   Creatinine, Ser 1.16 (*)    GFR, Estimated 57 (*)    All other components within normal limits  URINALYSIS, ROUTINE W REFLEX MICROSCOPIC - Abnormal; Notable for the following components:   APPearance HAZY (*)    Protein, ur 30 (*)    Leukocytes,Ua LARGE (*)    Bacteria, UA RARE (*)    All other components within normal limits    EKG None  Radiology No results found.  Procedures Procedures    Medications Ordered in ED Medications  cephALEXin (KEFLEX) capsule 500 mg (has no administration in time range)  oxyCODONE (Oxy IR/ROXICODONE) immediate release tablet 10 mg (10 mg Oral Given 12/27/22 1651)    ED  Course/ Medical Decision Making/ A&P                             Medical Decision Making   This patient presents to the ED for concern of urinary frequency, this involves an extensive number of treatment options, and is a complaint that carries with it a high risk of complications and morbidity.  The differential diagnosis includes nephrolithiasis, UTI, pyelonephritis, urinary obstruction.   Co morbidities that complicate the patient evaluation  none   Lab Tests:  I Ordered, and personally interpreted labs.  The pertinent results include:   CBC with differential: no concern for electrolyte abnormality; Cr mildly elevated but lower than baseline UA: Leukocytes (large); Protein (30); nitrite (negative)   Problem List / ED Course / Critical interventions / Medication management  Patient presented for abdominal pain and urinary symptoms concerning for urinary obstruction vs UTI. On exam patient was tender to palpation of lower abdominal quadrant. UA showed leukocytes. History of present illness, physical exam findings and urine tests make me more concerned for an uncomplicated UTI. Post voiding [place] without concern of urinary retention. CMP without concern of AKI. I shared with patient that I do not think they need inpatient treatment at this time, patient agreed stating that they are ready to go home. I ordered antibiotics for patient to receive in ED and prescription for rest of duration. I gave patient some pain management in ED which helped relieve pain. When I asked patient if she would like some antiinflammatories for home pain management, she stated that she would be fine with the medications that she has at home. Patient was given return precautions.patient stable for discharge at this time.  Patient verbalized understanding of plan.   Social Determinants of Health:  none          Final Clinical Impression(s) / ED Diagnoses Final diagnoses:  Acute cystitis without  hematuria    Rx / DC Orders ED Discharge Orders          Ordered    cephALEXin (KEFLEX) 500 MG capsule  2 times daily        12/27/22 1814              Margarita Rana 12/27/22 1826    Eber Hong, MD 12/29/22 2053

## 2022-12-27 NOTE — ED Notes (Signed)
Bladder scan only showed 7 mL in bladder.

## 2023-01-09 ENCOUNTER — Encounter: Payer: Self-pay | Admitting: Psychiatry

## 2023-01-12 ENCOUNTER — Inpatient Hospital Stay: Payer: Medicaid Other

## 2023-01-12 ENCOUNTER — Encounter: Payer: Self-pay | Admitting: Psychiatry

## 2023-01-12 ENCOUNTER — Inpatient Hospital Stay: Payer: Medicaid Other | Attending: Psychiatry | Admitting: Psychiatry

## 2023-01-12 ENCOUNTER — Inpatient Hospital Stay (HOSPITAL_BASED_OUTPATIENT_CLINIC_OR_DEPARTMENT_OTHER): Payer: Medicaid Other | Admitting: Gynecologic Oncology

## 2023-01-12 VITALS — BP 145/97 | HR 83 | Temp 97.8°F | Wt 160.0 lb

## 2023-01-12 DIAGNOSIS — R1900 Intra-abdominal and pelvic swelling, mass and lump, unspecified site: Secondary | ICD-10-CM

## 2023-01-12 DIAGNOSIS — N83202 Unspecified ovarian cyst, left side: Secondary | ICD-10-CM | POA: Insufficient documentation

## 2023-01-12 DIAGNOSIS — Z79899 Other long term (current) drug therapy: Secondary | ICD-10-CM | POA: Insufficient documentation

## 2023-01-12 DIAGNOSIS — Z8041 Family history of malignant neoplasm of ovary: Secondary | ICD-10-CM | POA: Diagnosis not present

## 2023-01-12 DIAGNOSIS — R1032 Left lower quadrant pain: Secondary | ICD-10-CM

## 2023-01-12 DIAGNOSIS — Z8042 Family history of malignant neoplasm of prostate: Secondary | ICD-10-CM | POA: Insufficient documentation

## 2023-01-12 DIAGNOSIS — Z87442 Personal history of urinary calculi: Secondary | ICD-10-CM | POA: Diagnosis not present

## 2023-01-12 DIAGNOSIS — Z90710 Acquired absence of both cervix and uterus: Secondary | ICD-10-CM | POA: Diagnosis not present

## 2023-01-12 DIAGNOSIS — I10 Essential (primary) hypertension: Secondary | ICD-10-CM | POA: Insufficient documentation

## 2023-01-12 DIAGNOSIS — Z803 Family history of malignant neoplasm of breast: Secondary | ICD-10-CM | POA: Diagnosis not present

## 2023-01-12 DIAGNOSIS — E079 Disorder of thyroid, unspecified: Secondary | ICD-10-CM | POA: Insufficient documentation

## 2023-01-12 LAB — CEA (ACCESS): CEA (CHCC): 8.41 ng/mL — ABNORMAL HIGH (ref 0.00–5.00)

## 2023-01-12 MED ORDER — SENNOSIDES-DOCUSATE SODIUM 8.6-50 MG PO TABS: 2.0000 | ORAL_TABLET | Freq: Every day | ORAL | 0 refills | Status: AC

## 2023-01-12 NOTE — Patient Instructions (Signed)
Preparing for your Surgery  Plan for surgery on Jan 20, 2023 with Dr. Clide Cliff at [facility]. You will be scheduled for robotic assisted bilateral salpingo-oophorectomy (removal of both ovaries and fallopian tubes), possible staging,  any any other indicated procedures.   Plan to have a CEA level tumor marker drawn today.  We have also placed a referral to genetics.   Pre-operative Testing -You will receive a phone call from presurgical testing at [facility] to arrange for a pre-operative appointment and lab work.  -Bring your insurance card, copy of an advanced directive if applicable, medication list  -At that visit, you will be asked to sign a consent for a possible blood transfusion in case a transfusion becomes necessary during surgery.  The need for a blood transfusion is rare but having consent is a necessary part of your care.     -You should not be taking blood thinners or aspirin at least ten days prior to surgery unless instructed by your surgeon.  -Do not take supplements such as fish oil (omega 3), red yeast rice, turmeric before your surgery. You want to avoid medications with aspirin in them including headache powders such as BC or Goody's), Excedrin migraine.  Day Before Surgery at Home -You will be asked to take in a light diet the day before surgery. You will be advised you can have clear liquids up until 3 hours before your surgery.    Eat a light diet the day before surgery.  Examples including soups, broths, toast, yogurt, mashed potatoes.  AVOID GAS PRODUCING FOODS AND BEVERAGES. Things to avoid include carbonated beverages (fizzy beverages, sodas), raw fruits and raw vegetables (uncooked), or beans.   If your bowels are filled with gas, your surgeon will have difficulty visualizing your pelvic organs which increases your surgical risks.  Your role in recovery Your role is to become active as soon as directed by your doctor, while still  giving yourself time to heal.  Rest when you feel tired. You will be asked to do the following in order to speed your recovery:  - Cough and breathe deeply. This helps to clear and expand your lungs and can prevent pneumonia after surgery.  - STAY ACTIVE WHEN YOU GET HOME. Do mild physical activity. Walking or moving your legs help your circulation and body functions return to normal. Do not try to get up or walk alone the first time after surgery.   -If you develop swelling on one leg or the other, pain in the back of your leg, redness/warmth in one of your legs, please call the office or go to the Emergency Room to have a doppler to rule out a blood clot. For shortness of breath, chest pain-seek care in the Emergency Room as soon as possible. - Actively manage your pain. Managing your pain lets you move in comfort. We will ask you to rate your pain on a scale of zero to 10. It is your responsibility to tell your doctor or nurse where and how much you hurt so your pain can be treated.  Special Considerations -If you are diabetic, you may be placed on insulin after surgery to have closer control over your blood sugars to promote healing and recovery.  This does not mean that you will be discharged on insulin.  If applicable, your oral antidiabetics will be resumed when you are tolerating a solid diet.  -Your final pathology results from surgery should be available around one week after  surgery and the results will be relayed to you when available.  -Dr. Antionette Char is the surgeon that assists your GYN Oncologist with surgery.  If you end up staying the night, the next day after your surgery you will either see Dr. Pricilla Holm, Dr. Alvester Morin, or Dr. Antionette Char.  -FMLA forms can be faxed to [phone] and please allow 5-7 business days for completion.  Pain Management After Surgery -Make sure that you have Tylenol and Ibuprofen IF YOU ARE ABLE TO TAKE THESE MEDICATIONS at home to use on a  regular basis after surgery for pain control. We recommend alternating the medications every hour to six hours since they work differently and are processed in the body differently for pain relief.  -Review the attached handout on narcotic use and their risks and side effects.   Bowel Regimen -You have been prescribed Sennakot-S to take nightly to prevent constipation especially if you are taking the narcotic pain medication intermittently.  It is important to prevent constipation and drink adequate amounts of liquids. You can stop taking this medication when you are not taking pain medication and you are back on your normal bowel routine.  Risks of Surgery Risks of surgery are low but include bleeding, infection, damage to surrounding structures, re-operation, blood clots, and very rarely death.   Blood Transfusion Information (For the consent to be signed before surgery)  We will be checking your blood type before surgery so in case of emergencies, we will know what type of blood you would need.                                            WHAT IS A BLOOD TRANSFUSION?  A transfusion is the replacement of blood or some of its parts. Blood is made up of multiple cells which provide different functions. Red blood cells carry oxygen and are used for blood loss replacement. White blood cells fight against infection. Platelets control bleeding. Plasma helps clot blood. Other blood products are available for specialized needs, such as hemophilia or other clotting disorders. BEFORE THE TRANSFUSION  Who gives blood for transfusions?  You may be able to donate blood to be used at a later date on yourself (autologous donation). Relatives can be asked to donate blood. This is generally not any safer than if you have received blood from a stranger. The same precautions are taken to ensure safety when a relative's blood is donated. Healthy volunteers who are fully evaluated to make sure their blood is  safe. This is blood bank blood. Transfusion therapy is the safest it has ever been in the practice of medicine. Before blood is taken from a donor, a complete history is taken to make sure that person has no history of diseases nor engages in risky social behavior (examples are intravenous drug use or sexual activity with multiple partners). The donor's travel history is screened to minimize risk of transmitting infections, such as malaria. The donated blood is tested for signs of infectious diseases, such as HIV and hepatitis. The blood is then tested to be sure it is compatible with you in order to minimize the chance of a transfusion reaction. If you or a relative donates blood, this is often done in anticipation of surgery and is not appropriate for emergency situations. It takes many days to process the donated blood. RISKS AND COMPLICATIONS Although transfusion therapy  is very safe and saves many lives, the main dangers of transfusion include:  Getting an infectious disease. Developing a transfusion reaction. This is an allergic reaction to something in the blood you were given. Every precaution is taken to prevent this. The decision to have a blood transfusion has been considered carefully by your caregiver before blood is given. Blood is not given unless the benefits outweigh the risks.  AFTER SURGERY INSTRUCTIONS  Return to work: 4-6 weeks if applicable  Activity: 1. Be up and out of the bed during the day.  Take a nap if needed.  You may walk up steps but be careful and use the hand rail.  Stair climbing will tire you more than you think, you may need to stop part way and rest.   2. No lifting or straining for 6 weeks over 10 pounds. No pushing, pulling, straining for 6 weeks.  3. No driving for around 1 week(s).  Do not drive if you are taking narcotic pain medicine and make sure that your reaction time has returned.   4. You can shower as soon as the next day after surgery. Shower  daily.  Use your regular soap and water (not directly on the incision) and pat your incision(s) dry afterwards; don't rub.  No tub baths or submerging your body in water until cleared by your surgeon. If you have the soap that was given to you by pre-surgical testing that was used before surgery, you do not need to use it afterwards because this can irritate your incisions.   5. No sexual activity and nothing in the vagina for 4-6 weeks.  6. You may experience a small amount of clear drainage from your incisions, which is normal.  If the drainage persists, increases, or changes color please call the office.  7. Do not use creams, lotions, or ointments such as neosporin on your incisions after surgery until advised by your surgeon because they can cause removal of the dermabond glue on your incisions.    8. Take Tylenol or ibuprofen first for pain if you are able to take these medications and only use narcotic pain medication for severe pain not relieved by the Tylenol or Ibuprofen.  Monitor your Tylenol intake to a max of 4,000 mg in a 24 hour period. You can alternate these medications after surgery.  Diet: 1. Low sodium Heart Healthy Diet is recommended but you are cleared to resume your normal (before surgery) diet after your procedure.  2. It is safe to use a laxative, such as Miralax or Colace, if you have difficulty moving your bowels. You have been prescribed Sennakot-S to take at bedtime every evening after surgery to keep bowel movements regular and to prevent constipation.    Wound Care: 1. Keep clean and dry.  Shower daily.  Reasons to call the Doctor: Fever - Oral temperature greater than 100.4 degrees Fahrenheit Foul-smelling vaginal discharge Difficulty urinating Nausea and vomiting Increased pain at the site of the incision that is unrelieved with pain medicine. Difficulty breathing with or without chest pain New calf pain especially if only on one side Sudden, continuing  increased vaginal bleeding with or without clots.   Contacts: For questions or concerns you should contact:  Dr. Clide Cliff at [phone]  Warner Mccreedy, NP at [phone]  After Hours: call [phone] and have the GYN Oncologist paged/contacted (after 5 pm or on the weekends). You will speak with an after hours RN and let he or she know you  have had surgery.  Messages sent via mychart are for non-urgent matters and are not responded to after hours so for urgent needs, please call the after hours number.

## 2023-01-12 NOTE — Progress Notes (Unsigned)
GYNECOLOGIC ONCOLOGY NEW PATIENT CONSULTATION  Date of Service: 01/12/2023 Referring Provider: Duane Lope, MD   ASSESSMENT AND PLAN: Sue Schmidt is a 52 y.o. woman with 3.5cm cystic mass in the left ovary, LLQ pain and normal CA125 but elevated HE4.  We reviewed that the exact etiology of the pelvic mass is unclear, but could include a benign, borderline, or malignant process.  The recommended treatment is surgical excision to make a definitive diagnosis. Because the mass is relatively small, we feel that a minimally invasive approach is feasible, using robotic assistance.    Reviewed that the normal CA125 is overall reassuring.  The HE4 elevation is possibly related to her chronic kidney disease as this has been demonstrated in prior studies.  However the only way to know for sure if the elevation is due to a borderline or malignant processes to remove the ovary.  In the event of malignancy or borderline tumor on frozen section, we will perform indicated staging procedures. We discussed that these procedures may include omentectomy pelvic and/or para-aortic lymphadenectomy, peritoneal biopsies. We would also remove any tissue concerning for metastatic disease which could require additional procedures including bowel surgery.  Patient was consented for: Robotic assisted bilateral salpingo-oophorectomy, possible staging on 01/20/23.  The risks of surgery were discussed in detail and she understands these to including but not limited to bleeding requiring a blood transfusion, infection, injury to adjacent organs (including but not limited to the bowels, bladder, ureters, nerves, blood vessels), thromboembolic events, wound separation, hernia, possible risk of lymphedema and lymphocyst if lymphadenectomy performed, unforseen complication, and possible need for re-exploration.  If the patient experiences any of these events, she understands that her hospitalization or recovery may be prolonged and  that she may need to take additional medications for a prolonged period. The patient will receive DVT and antibiotic prophylaxis as indicated. She voiced a clear understanding. She had the opportunity to ask questions and informed consent was obtained today. She wishes to proceed.  She will proceed to the lab today for CEA.  This returned elevated at 8.41.  Patient has recent endoscopy and colonoscopy in January 2024 with biopsies without evidence of malignancy or dysplasia.  Likely nothing further to do preoperatively but will reach out to patient's gastroenterologist. She does not require preoperative clearance. Her METs are >4.  All preoperative instructions were reviewed. Postoperative expectations were also reviewed. Written handouts were provided to the patient.  Given her family history of reported ovarian cancer in a maternal grandmother maternal aunt, plan for referral to genetics.  A copy of this note was sent to the patient's referring provider.  Clide Cliff, MD Gynecologic Oncology   Medical Decision Making I personally spent  TOTAL 50 minutes face-to-face and non-face-to-face in the care of this patient, which includes all pre, intra, and post visit time on the date of service.   ------------  CC: Ovarian mass  HISTORY OF PRESENT ILLNESS:  Sue Schmidt is a 52 y.o. woman who is seen in consultation at the request of Duane Lope, MD for evaluation of ovarian mass.  Patient presented to her OB/GYN on 11/28/2022 for left lower quadrant pain for 2 months.  She had undergone a pelvic ultrasound on 11/21/2022 which noted a complex solid and cystic mass in the left ovary measuring 4.7 x 3.3 x 5.5 cm.  Prior CT renal stone study 10/29/2022 noted this mass in the left adnexa but with no other acute findings in the abdomen or pelvis.  She was recommended  to undergo tumor marker testing which resulted with a CA125 of 14.5, H E4 of 216 (elevated), with a elevated premenopausal Roma and  postmenopausal Roma.  Patient presents today with her mom.  She reports new bloating in the last few weeks.  She also notes recent back pain that she describes as "burning."  She otherwise reports left lower quadrant pain with sharp shooting pains that radiate towards her vagina.  She does not have an appetite but no particular early satiety.  She reports 100 pound weight loss in the past 6 months to a year.  She has been following with GI most recently with a colonoscopy and EGD on 10/07/2022.  Pathology that was obtained was benign.  No H. pylori.  She reports some diarrhea since her diet has changed.  And she reports some pressure when emptying her bladder.  Patient otherwise has a history of chronic kidney disease.  Most recently her creatinine was 1.16 but has previously been elevated as high as 1.74 in May 2022.  Patient reports that she was prescribed hydrocodone for her left lower quadrant pain.  She is not taking this daily and only recently started this a few weeks ago.  She takes as needed for pain.   PAST MEDICAL HISTORY: Past Medical History:  Diagnosis Date   Anxiety and depression    Hypertension    Kidney stones    Mental disorder    Migraines    Thyroid disease    Vitamin D deficiency     PAST SURGICAL HISTORY: Past Surgical History:  Procedure Laterality Date   BIOPSY  10/07/2022   Procedure: BIOPSY;  Surgeon: Lanelle Bal, DO;  Location: [facility];  Service: Endoscopy;;   CHOLECYSTECTOMY     Laparoscopic   COLONOSCOPY WITH PROPOFOL N/A 10/07/2022   Procedure: COLONOSCOPY WITH PROPOFOL;  Surgeon: Lanelle Bal, DO;  Location: [facility];  Service: Endoscopy;  Laterality: N/A;  1:15 pm   CYSTOSCOPY WITH RETROGRADE PYELOGRAM, URETEROSCOPY AND STENT PLACEMENT Right 01/31/2021   Procedure: CYSTOSCOPY WITH RIGHT RETROGRADE PYELOGRAM, RIGHT URETEROSCOPY AND RIGHT URETERAL STENT PLACEMENT;  Surgeon: Malen Gauze, MD;  Location: [facility];  Service: Urology;   Laterality: Right;   ESOPHAGOGASTRODUODENOSCOPY (EGD) WITH PROPOFOL N/A 10/07/2022   Procedure: ESOPHAGOGASTRODUODENOSCOPY (EGD) WITH PROPOFOL;  Surgeon: Lanelle Bal, DO;  Location: [facility];  Service: Endoscopy;  Laterality: N/A;   LAPAROSCOPIC HYSTERECTOMY     AUB   POLYPECTOMY  10/07/2022   Procedure: POLYPECTOMY;  Surgeon: Lanelle Bal, DO;  Location: [facility];  Service: Endoscopy;;   STONE EXTRACTION WITH BASKET Right 01/31/2021   Procedure: STONE EXTRACTION WITH BASKET;  Surgeon: Malen Gauze, MD;  Location: [facility];  Service: Urology;  Laterality: Right;    OB/GYN HISTORY: OB History  Gravida Para Term Preterm AB Living  2 2 2     2   SAB IAB Ectopic Multiple Live Births          2    # Outcome Date GA Lbr Len/2nd Weight Sex Delivery Anes PTL Lv  2 Term      Vag-Spont   LIV  1 Term      Vag-Spont   LIV      Age at menarche: 84 Age at menopause: 65 Hx of HRT: no Hx of STI: no Last pap: 2019 History of abnormal pap smears: Denies  SCREENING STUDIES:  Last mammogram: 2019 Last colonoscopy: 2023 - repeat in 3 years  MEDICATIONS:  Current Outpatient Medications:    amLODipine (NORVASC) 10 MG tablet, Take 10 mg by mouth daily., Disp: , Rfl:    cephALEXin (KEFLEX) 500 MG capsule, Take 1 capsule (500 mg total) by mouth 2 (two) times daily., Disp: 14 capsule, Rfl: 0   levothyroxine (SYNTHROID) 175 MCG tablet, Take 175 mcg by mouth daily., Disp: , Rfl:    losartan (COZAAR) 100 MG tablet, Take 100 mg by mouth daily., Disp: , Rfl:    promethazine (PHENERGAN) 25 MG tablet, Take 1 tablet by mouth 4 (four) times daily as needed., Disp: , Rfl:    SUMAtriptan (IMITREX) 100 MG tablet, Take 100 mg by mouth as needed for headache., Disp: , Rfl:    HYDROcodone-acetaminophen (NORCO) 10-325 MG tablet, TAKE ONE TABLET BY MOUTH EVERY 4 HOURS AS NEEDED FOR PAIN FOR 5 DAYS, Disp: , Rfl:   ALLERGIES: Allergies  Allergen Reactions   Shellfish Allergy    Tape  Dermatitis    Patient describes a "burn" type reaction when tape is removed    FAMILY HISTORY: Family History  Problem Relation Age of Onset   Hypertension Mother    Prostate cancer Father    Ovarian cancer Maternal Grandmother    Breast cancer Paternal Grandmother    Ovarian cancer Maternal Aunt    Colon cancer Neg Hx    Colon polyps Neg Hx    Endometrial cancer Neg Hx    Pancreatic cancer Neg Hx     SOCIAL HISTORY: Social History   Socioeconomic History   Marital status: Single    Spouse name: Not on file   Number of children: Not on file   Years of education: Not on file   Highest education level: Not on file  Occupational History   Not on file  Tobacco Use   Smoking status: Never   Smokeless tobacco: Never  Vaping Use   Vaping Use: Never used  Substance and Sexual Activity   Alcohol use: Not Currently    Comment: Occ   Drug use: Not Currently    Types: Marijuana   Sexual activity: Yes    Birth control/protection: Surgical    Comment: hyst  Other Topics Concern   Not on file  Social History Narrative   Not on file   Social Determinants of Health   Financial Resource Strain: Low Risk  (11/28/2022)   Overall Financial Resource Strain (CARDIA)    Difficulty of Paying Living Expenses: Not very hard  Food Insecurity: No Food Insecurity (01/09/2023)   Hunger Vital Sign    Worried About Running Out of Food in the Last Year: Never true    Ran Out of Food in the Last Year: Never true  Transportation Needs: No Transportation Needs (01/09/2023)   PRAPARE - Administrator, Civil Service (Medical): No    Lack of Transportation (Non-Medical): No  Physical Activity: Insufficiently Active (11/28/2022)   Exercise Vital Sign    Days of Exercise per Week: 3 days    Minutes of Exercise per Session: 30 min  Stress: No Stress Concern Present (11/28/2022)   [employer] of Occupational Health - Occupational Stress Questionnaire    Feeling of Stress : Only a  little  Social Connections: Socially Isolated (11/28/2022)   Social Connection and Isolation Panel [NHANES]    Frequency of Communication with Friends and Family: Three times a week    Frequency of Social Gatherings with Friends and Family: Once a week    Attends Religious Services: Never  Active Member of Clubs or Organizations: No    Attends Banker Meetings: Never    Marital Status: Never married  Intimate Partner Violence: Not At Risk (01/09/2023)   Humiliation, Afraid, Rape, and Kick questionnaire    Fear of Current or Ex-Partner: No    Emotionally Abused: No    Physically Abused: No    Sexually Abused: No    REVIEW OF SYSTEMS: New patient intake form was reviewed.  Complete 10-system review is negative except for the following: Appetite change, shortness of breath, constipation, urinary frequency, joint pain, easy bruising/bleeding, pelvic pain, back pain, anxiety, fatigue, abdominal pain, unexplained weight loss, painful urination, hot flashes  PHYSICAL EXAM: BP (!) 145/97 (BP Location: Left Arm, Patient Position: Sitting)   Pulse 83   Temp 97.8 F (36.6 C) (Oral)   Wt 160 lb (72.6 kg)   SpO2 100%   BMI 27.46 kg/m  Constitutional: No acute distress. Neuro/Psych: Alert, oriented.  Head and Neck: Normocephalic, atraumatic. Neck symmetric without masses. Sclera anicteric.  Respiratory: Normal work of breathing. Clear to auscultation bilaterally. Cardiovascular: Regular rate and rhythm, no murmurs, rubs, or gallops. Abdomen: Normoactive bowel sounds. Soft, non-distended, non-tender to palpation. No masses or hepatosplenomegaly appreciated. No evidence of hernia. No palpable fluid wave. Well healed laparoscopic incisions. Extremities: Grossly normal range of motion. Warm, well perfused. No edema bilaterally. Skin: No rashes or lesions. Lymphatic: No cervical, supraclavicular, or inguinal adenopathy. Genitourinary: External genitalia without lesions. Urethral  meatus without lesions or prolapse. On speculum exam, vagina without lesions. Bimanual exam reveals smooth vaginal cuff. Fullness in left adnexa. Rectovaginal exam confirms the above findings and reveals normal sphincter tone and no masses or nodularity. Exam chaperoned by Renaldo Reel, RN   LABORATORY AND RADIOLOGIC DATA: Outside medical records were reviewed to synthesize the above history, along with the history and physical obtained during the visit.  Outside laboratory, pathology, and imaging reports were reviewed, with pertinent results below.  I personally reviewed the outside images.  WBC  Date Value Ref Range Status  01/31/2021 8.2 4.0 - 10.5 K/uL Final   Hemoglobin  Date Value Ref Range Status  01/31/2021 13.9 12.0 - 15.0 g/dL Final   HCT  Date Value Ref Range Status  01/31/2021 44.0 36.0 - 46.0 % Final   Platelets  Date Value Ref Range Status  01/31/2021 142 (L) 150 - 400 K/uL Final   Creatinine, Ser  Date Value Ref Range Status  12/27/2022 1.16 (H) 0.44 - 1.00 mg/dL Final   AST  Date Value Ref Range Status  12/27/2022 15 15 - 41 U/L Final   ALT  Date Value Ref Range Status  12/27/2022 15 0 - 44 U/L Final   Cancer Antigen (CA) 125  Date Value Ref Range Status  11/28/2022 14.5 0.0 - 38.1 U/mL Final    Comment:    Roche [employer] Electrochemiluminescence Immunoassay (ECLIA) Values obtained with different assay methods or kits cannot be used interchangeably.  Results cannot be interpreted as absolute evidence of the presence or absence of malignant disease.   HE4: 216 Premenopausal ROMA: 7.23 (elevated) Postmenopausal ROMA: 3.68 (elevated)  US PELVIC COMPLETE WITH TRANSVAGINAL 11/21/2022  Narrative CLINICAL DATA:  Pelvic and perineal pain for 2-3 weeks. Pain is worse on the LEFT. Prior hysterectomy.  EXAM: TRANSABDOMINAL AND TRANSVAGINAL ULTRASOUND OF PELVIS  TECHNIQUE: Both transabdominal and transvaginal ultrasound examinations of the pelvis were  performed. Transabdominal technique was performed for global imaging of the pelvis including uterus, ovaries, adnexal regions, and  pelvic cul-de-sac. It was necessary to proceed with endovaginal exam following the transabdominal exam to visualize the adnexal regions.  COMPARISON:  CT of the abdomen and pelvis on 10/29/2022  FINDINGS: Uterus  Prior hysterectomy.  The vaginal cuff is unremarkable.  Right ovary  Not visualized.  No adnexal mass identified.  Left ovary  Measurements: 4.7 x 3.3 x 5.5 centimeters = volume: 44.0 mL. Within the ovary, there is a heterogeneous mass measuring 3.5 x 2.7 x 3.4 centimeters. Additionally, there are adjacent cystic components, measuring up to 1.9 centimeters.  Other findings  No abnormal free fluid.  IMPRESSION: 1. Prior hysterectomy. 2. Complex solid and cystic mass in the LEFT ovary. Mass is indeterminate. Recommend further evaluation with MRI and surgical evaluation. Otherwise, pelvic ultrasound would be suggested in 8-12 weeks. 3. Nonvisualized RIGHT ovary.   Electronically Signed By: Norva Pavlov M.D. On: 11/22/2022 15:58   CT RENAL STONE STUDY 10/29/2022  Narrative CLINICAL DATA:  Left-sided abdominal pain. History of kidney stones.  EXAM: CT ABDOMEN AND PELVIS WITHOUT CONTRAST  TECHNIQUE: Multidetector CT imaging of the abdomen and pelvis was performed following the standard protocol without IV contrast.  RADIATION DOSE REDUCTION: This exam was performed according to the departmental dose-optimization program which includes automated exposure control, adjustment of the mA and/or kV according to patient size and/or use of iterative reconstruction technique.  COMPARISON:  01/30/2021  FINDINGS: Lower chest: Unremarkable.  Hepatobiliary: Tiny hypodensity in the lateral segment left liver is too small to characterize but stable in the interval consistent with benign etiology. No followup imaging is  recommended. Gallbladder is surgically absent. No intrahepatic or extrahepatic biliary dilation.  Pancreas: No focal mass lesion. No dilatation of the main duct. No intraparenchymal cyst. No peripancreatic edema.  Spleen: No splenomegaly. No focal mass lesion.  Adrenals/Urinary Tract: No adrenal nodule or mass. Kidneys unremarkable. No evidence for hydroureter. The urinary bladder appears normal for the degree of distention.  Stomach/Bowel: Stomach is unremarkable. No gastric wall thickening. No evidence of outlet obstruction. Duodenum is normally positioned as is the ligament of Treitz. No small bowel wall thickening. No small bowel dilatation. The terminal ileum is normal. The appendix is normal. No gross colonic mass. No colonic wall thickening.  Vascular/Lymphatic: No abdominal aortic aneurysm. There is no gastrohepatic or hepatoduodenal ligament lymphadenopathy. No retroperitoneal or mesenteric lymphadenopathy. No pelvic sidewall lymphadenopathy.  Reproductive: Hysterectomy. There is a new 4.2 x 3.4 cm multicystic lesion identified in the left adnexal space, limited assessment and not adequately characterized on this noncontrast study.  Other: No intraperitoneal free fluid.  Musculoskeletal: No worrisome lytic or sclerotic osseous abnormality.  IMPRESSION: 1. New 4.2 x 3.4 cm multicystic lesion in the left adnexal space, limited assessment and not adequately characterized on this noncontrast study. Recommend prompt follow-up with pelvic ultrasound. Reference: JACR 2020 Feb;17(2):. 2. Otherwise, acute findings in the abdomen or pelvis. Specifically, no evidence for urinary stone disease.   Electronically Signed By: Kennith Center M.D. On: 10/29/2022 16:38

## 2023-01-12 NOTE — H&P (View-Only) (Signed)
GYNECOLOGIC ONCOLOGY NEW PATIENT CONSULTATION  Date of Service: 01/12/2023 Referring Provider: Luther Eure, MD   ASSESSMENT AND PLAN: Sue Schmidt is a 52 y.o. woman with 3.5cm cystic mass in the left ovary, LLQ pain and normal CA125 but elevated HE4.  We reviewed that the exact etiology of the pelvic mass is unclear, but could include a benign, borderline, or malignant process.  The recommended treatment is surgical excision to make a definitive diagnosis. Because the mass is relatively small, we feel that a minimally invasive approach is feasible, using robotic assistance.    Reviewed that the normal CA125 is overall reassuring.  The HE4 elevation is possibly related to her chronic kidney disease as this has been demonstrated in prior studies.  However the only way to know for sure if the elevation is due to a borderline or malignant processes to remove the ovary.  In the event of malignancy or borderline tumor on frozen section, we will perform indicated staging procedures. We discussed that these procedures may include omentectomy pelvic and/or para-aortic lymphadenectomy, peritoneal biopsies. We would also remove any tissue concerning for metastatic disease which could require additional procedures including bowel surgery.  Patient was consented for: Robotic assisted bilateral salpingo-oophorectomy, possible staging on 01/20/23.  The risks of surgery were discussed in detail and she understands these to including but not limited to bleeding requiring a blood transfusion, infection, injury to adjacent organs (including but not limited to the bowels, bladder, ureters, nerves, blood vessels), thromboembolic events, wound separation, hernia, possible risk of lymphedema and lymphocyst if lymphadenectomy performed, unforseen complication, and possible need for re-exploration.  If the patient experiences any of these events, she understands that her hospitalization or recovery may be prolonged and  that she may need to take additional medications for a prolonged period. The patient will receive DVT and antibiotic prophylaxis as indicated. She voiced a clear understanding. She had the opportunity to ask questions and informed consent was obtained today. She wishes to proceed.  She will proceed to the lab today for CEA.  This returned elevated at 8.41.  Patient has recent endoscopy and colonoscopy in January 2024 with biopsies without evidence of malignancy or dysplasia.  Likely nothing further to do preoperatively but will reach out to patient's gastroenterologist. She does not require preoperative clearance. Her METs are >4.  All preoperative instructions were reviewed. Postoperative expectations were also reviewed. Written handouts were provided to the patient.  Given her family history of reported ovarian cancer in a maternal grandmother maternal aunt, plan for referral to genetics.  A copy of this note was sent to the patient's referring provider.  Gatlin Kittell, MD Gynecologic Oncology   Medical Decision Making I personally spent  TOTAL 50 minutes face-to-face and non-face-to-face in the care of this patient, which includes all pre, intra, and post visit time on the date of service.   ------------  CC: Ovarian mass  HISTORY OF PRESENT ILLNESS:  Sue Schmidt is a 52 y.o. woman who is seen in consultation at the request of Luther Eure, MD for evaluation of ovarian mass.  Patient presented to her OB/GYN on 11/28/2022 for left lower quadrant pain for 2 months.  She had undergone a pelvic ultrasound on 11/21/2022 which noted a complex solid and cystic mass in the left ovary measuring 4.7 x 3.3 x 5.5 cm.  Prior CT renal stone study 10/29/2022 noted this mass in the left adnexa but with no other acute findings in the abdomen or pelvis.  She was recommended   to undergo tumor marker testing which resulted with a CA125 of 14.5, H E4 of 216 (elevated), with a elevated premenopausal Roma and  postmenopausal Roma.  Patient presents today with her mom.  She reports new bloating in the last few weeks.  She also notes recent back pain that she describes as "burning."  She otherwise reports left lower quadrant pain with sharp shooting pains that radiate towards her vagina.  She does not have an appetite but no particular early satiety.  She reports 100 pound weight loss in the past 6 months to a year.  She has been following with GI most recently with a colonoscopy and EGD on 10/07/2022.  Pathology that was obtained was benign.  No H. pylori.  She reports some diarrhea since her diet has changed.  And she reports some pressure when emptying her bladder.  Patient otherwise has a history of chronic kidney disease.  Most recently her creatinine was 1.16 but has previously been elevated as high as 1.74 in May 2022.  Patient reports that she was prescribed hydrocodone for her left lower quadrant pain.  She is not taking this daily and only recently started this a few weeks ago.  She takes as needed for pain.   PAST MEDICAL HISTORY: Past Medical History:  Diagnosis Date   Anxiety and depression    Hypertension    Kidney stones    Mental disorder    Migraines    Thyroid disease    Vitamin D deficiency     PAST SURGICAL HISTORY: Past Surgical History:  Procedure Laterality Date   BIOPSY  10/07/2022   Procedure: BIOPSY;  Surgeon: Carver, Charles K, DO;  Location: [facility];  Service: Endoscopy;;   CHOLECYSTECTOMY     Laparoscopic   COLONOSCOPY WITH PROPOFOL N/A 10/07/2022   Procedure: COLONOSCOPY WITH PROPOFOL;  Surgeon: Carver, Charles K, DO;  Location: [facility];  Service: Endoscopy;  Laterality: N/A;  1:15 pm   CYSTOSCOPY WITH RETROGRADE PYELOGRAM, URETEROSCOPY AND STENT PLACEMENT Right 01/31/2021   Procedure: CYSTOSCOPY WITH RIGHT RETROGRADE PYELOGRAM, RIGHT URETEROSCOPY AND RIGHT URETERAL STENT PLACEMENT;  Surgeon: McKenzie, Patrick L, MD;  Location: [facility];  Service: Urology;   Laterality: Right;   ESOPHAGOGASTRODUODENOSCOPY (EGD) WITH PROPOFOL N/A 10/07/2022   Procedure: ESOPHAGOGASTRODUODENOSCOPY (EGD) WITH PROPOFOL;  Surgeon: Carver, Charles K, DO;  Location: [facility];  Service: Endoscopy;  Laterality: N/A;   LAPAROSCOPIC HYSTERECTOMY     AUB   POLYPECTOMY  10/07/2022   Procedure: POLYPECTOMY;  Surgeon: Carver, Charles K, DO;  Location: [facility];  Service: Endoscopy;;   STONE EXTRACTION WITH BASKET Right 01/31/2021   Procedure: STONE EXTRACTION WITH BASKET;  Surgeon: McKenzie, Patrick L, MD;  Location: [facility];  Service: Urology;  Laterality: Right;    OB/GYN HISTORY: OB History  Gravida Para Term Preterm AB Living  2 2 2     2  SAB IAB Ectopic Multiple Live Births          2    # Outcome Date GA Lbr Len/2nd Weight Sex Delivery Anes PTL Lv  2 Term      Vag-Spont   LIV  1 Term      Vag-Spont   LIV      Age at menarche: 14 Age at menopause: 48 Hx of HRT: no Hx of STI: no Last pap: 2019 History of abnormal pap smears: Denies  SCREENING STUDIES:  Last mammogram: 2019 Last colonoscopy: 2023 - repeat in 3 years  MEDICATIONS:    Current Outpatient Medications:    amLODipine (NORVASC) 10 MG tablet, Take 10 mg by mouth daily., Disp: , Rfl:    cephALEXin (KEFLEX) 500 MG capsule, Take 1 capsule (500 mg total) by mouth 2 (two) times daily., Disp: 14 capsule, Rfl: 0   levothyroxine (SYNTHROID) 175 MCG tablet, Take 175 mcg by mouth daily., Disp: , Rfl:    losartan (COZAAR) 100 MG tablet, Take 100 mg by mouth daily., Disp: , Rfl:    promethazine (PHENERGAN) 25 MG tablet, Take 1 tablet by mouth 4 (four) times daily as needed., Disp: , Rfl:    SUMAtriptan (IMITREX) 100 MG tablet, Take 100 mg by mouth as needed for headache., Disp: , Rfl:    HYDROcodone-acetaminophen (NORCO) 10-325 MG tablet, TAKE ONE TABLET BY MOUTH EVERY 4 HOURS AS NEEDED FOR PAIN FOR 5 DAYS, Disp: , Rfl:   ALLERGIES: Allergies  Allergen Reactions   Shellfish Allergy    Tape  Dermatitis    Patient describes a "burn" type reaction when tape is removed    FAMILY HISTORY: Family History  Problem Relation Age of Onset   Hypertension Mother    Prostate cancer Father    Ovarian cancer Maternal Grandmother    Breast cancer Paternal Grandmother    Ovarian cancer Maternal Aunt    Colon cancer Neg Hx    Colon polyps Neg Hx    Endometrial cancer Neg Hx    Pancreatic cancer Neg Hx     SOCIAL HISTORY: Social History   Socioeconomic History   Marital status: Single    Spouse name: Not on file   Number of children: Not on file   Years of education: Not on file   Highest education level: Not on file  Occupational History   Not on file  Tobacco Use   Smoking status: Never   Smokeless tobacco: Never  Vaping Use   Vaping Use: Never used  Substance and Sexual Activity   Alcohol use: Not Currently    Comment: Occ   Drug use: Not Currently    Types: Marijuana   Sexual activity: Yes    Birth control/protection: Surgical    Comment: hyst  Other Topics Concern   Not on file  Social History Narrative   Not on file   Social Determinants of Health   Financial Resource Strain: Low Risk  (11/28/2022)   Overall Financial Resource Strain (CARDIA)    Difficulty of Paying Living Expenses: Not very hard  Food Insecurity: No Food Insecurity (01/09/2023)   Hunger Vital Sign    Worried About Running Out of Food in the Last Year: Never true    Ran Out of Food in the Last Year: Never true  Transportation Needs: No Transportation Needs (01/09/2023)   PRAPARE - Transportation    Lack of Transportation (Medical): No    Lack of Transportation (Non-Medical): No  Physical Activity: Insufficiently Active (11/28/2022)   Exercise Vital Sign    Days of Exercise per Week: 3 days    Minutes of Exercise per Session: 30 min  Stress: No Stress Concern Present (11/28/2022)   [employer] - Occupational Stress Questionnaire    Feeling of Stress : Only a  little  Social Connections: Socially Isolated (11/28/2022)   Social Connection and Isolation Panel [NHANES]    Frequency of Communication with Friends and Family: Three times a week    Frequency of Social Gatherings with Friends and Family: Once a week    Attends Religious Services: Never      Active Member of Clubs or Organizations: No    Attends Club or Organization Meetings: Never    Marital Status: Never married  Intimate Partner Violence: Not At Risk (01/09/2023)   Humiliation, Afraid, Rape, and Kick questionnaire    Fear of Current or Ex-Partner: No    Emotionally Abused: No    Physically Abused: No    Sexually Abused: No    REVIEW OF SYSTEMS: New patient intake form was reviewed.  Complete 10-system review is negative except for the following: Appetite change, shortness of breath, constipation, urinary frequency, joint pain, easy bruising/bleeding, pelvic pain, back pain, anxiety, fatigue, abdominal pain, unexplained weight loss, painful urination, hot flashes  PHYSICAL EXAM: BP (!) 145/97 (BP Location: Left Arm, Patient Position: Sitting)   Pulse 83   Temp 97.8 F (36.6 C) (Oral)   Wt 160 lb (72.6 kg)   SpO2 100%   BMI 27.46 kg/m  Constitutional: No acute distress. Neuro/Psych: Alert, oriented.  Head and Neck: Normocephalic, atraumatic. Neck symmetric without masses. Sclera anicteric.  Respiratory: Normal work of breathing. Clear to auscultation bilaterally. Cardiovascular: Regular rate and rhythm, no murmurs, rubs, or gallops. Abdomen: Normoactive bowel sounds. Soft, non-distended, non-tender to palpation. No masses or hepatosplenomegaly appreciated. No evidence of hernia. No palpable fluid wave. Well healed laparoscopic incisions. Extremities: Grossly normal range of motion. Warm, well perfused. No edema bilaterally. Skin: No rashes or lesions. Lymphatic: No cervical, supraclavicular, or inguinal adenopathy. Genitourinary: External genitalia without lesions. Urethral  meatus without lesions or prolapse. On speculum exam, vagina without lesions. Bimanual exam reveals smooth vaginal cuff. Fullness in left adnexa. Rectovaginal exam confirms the above findings and reveals normal sphincter tone and no masses or nodularity. Exam chaperoned by Karen Hess, RN   LABORATORY AND RADIOLOGIC DATA: Outside medical records were reviewed to synthesize the above history, along with the history and physical obtained during the visit.  Outside laboratory, pathology, and imaging reports were reviewed, with pertinent results below.  I personally reviewed the outside images.  WBC  Date Value Ref Range Status  01/31/2021 8.2 4.0 - 10.5 K/uL Final   Hemoglobin  Date Value Ref Range Status  01/31/2021 13.9 12.0 - 15.0 g/dL Final   HCT  Date Value Ref Range Status  01/31/2021 44.0 36.0 - 46.0 % Final   Platelets  Date Value Ref Range Status  01/31/2021 142 (L) 150 - 400 K/uL Final   Creatinine, Ser  Date Value Ref Range Status  12/27/2022 1.16 (H) 0.44 - 1.00 mg/dL Final   AST  Date Value Ref Range Status  12/27/2022 15 15 - 41 U/L Final   ALT  Date Value Ref Range Status  12/27/2022 15 0 - 44 U/L Final   Cancer Antigen (CA) 125  Date Value Ref Range Status  11/28/2022 14.5 0.0 - 38.1 U/mL Final    Comment:    [employer] Electrochemiluminescence Immunoassay (ECLIA) Values obtained with different assay methods or kits cannot be used interchangeably.  Results cannot be interpreted as absolute evidence of the presence or absence of malignant disease.   HE4: 216 Premenopausal ROMA: 7.23 (elevated) Postmenopausal ROMA: 3.68 (elevated)  US PELVIC COMPLETE WITH TRANSVAGINAL 11/21/2022  Narrative CLINICAL DATA:  Pelvic and perineal pain for 2-3 weeks. Pain is worse on the LEFT. Prior hysterectomy.  EXAM: TRANSABDOMINAL AND TRANSVAGINAL ULTRASOUND OF PELVIS  TECHNIQUE: Both transabdominal and transvaginal ultrasound examinations of the pelvis were  performed. Transabdominal technique was performed for global imaging of the pelvis including uterus, ovaries, adnexal regions, and   pelvic cul-de-sac. It was necessary to proceed with endovaginal exam following the transabdominal exam to visualize the adnexal regions.  COMPARISON:  CT of the abdomen and pelvis on 10/29/2022  FINDINGS: Uterus  Prior hysterectomy.  The vaginal cuff is unremarkable.  Right ovary  Not visualized.  No adnexal mass identified.  Left ovary  Measurements: 4.7 x 3.3 x 5.5 centimeters = volume: 44.0 mL. Within the ovary, there is a heterogeneous mass measuring 3.5 x 2.7 x 3.4 centimeters. Additionally, there are adjacent cystic components, measuring up to 1.9 centimeters.  Other findings  No abnormal free fluid.  IMPRESSION: 1. Prior hysterectomy. 2. Complex solid and cystic mass in the LEFT ovary. Mass is indeterminate. Recommend further evaluation with MRI and surgical evaluation. Otherwise, pelvic ultrasound would be suggested in 8-12 weeks. 3. Nonvisualized RIGHT ovary.   Electronically Signed By: Elizabeth  Brown M.D. On: 11/22/2022 15:58   CT RENAL STONE STUDY 10/29/2022  Narrative CLINICAL DATA:  Left-sided abdominal pain. History of kidney stones.  EXAM: CT ABDOMEN AND PELVIS WITHOUT CONTRAST  TECHNIQUE: Multidetector CT imaging of the abdomen and pelvis was performed following the standard protocol without IV contrast.  RADIATION DOSE REDUCTION: This exam was performed according to the departmental dose-optimization program which includes automated exposure control, adjustment of the mA and/or kV according to patient size and/or use of iterative reconstruction technique.  COMPARISON:  01/30/2021  FINDINGS: Lower chest: Unremarkable.  Hepatobiliary: Tiny hypodensity in the lateral segment left liver is too small to characterize but stable in the interval consistent with benign etiology. No followup imaging is  recommended. Gallbladder is surgically absent. No intrahepatic or extrahepatic biliary dilation.  Pancreas: No focal mass lesion. No dilatation of the main duct. No intraparenchymal cyst. No peripancreatic edema.  Spleen: No splenomegaly. No focal mass lesion.  Adrenals/Urinary Tract: No adrenal nodule or mass. Kidneys unremarkable. No evidence for hydroureter. The urinary bladder appears normal for the degree of distention.  Stomach/Bowel: Stomach is unremarkable. No gastric wall thickening. No evidence of outlet obstruction. Duodenum is normally positioned as is the ligament of Treitz. No small bowel wall thickening. No small bowel dilatation. The terminal ileum is normal. The appendix is normal. No gross colonic mass. No colonic wall thickening.  Vascular/Lymphatic: No abdominal aortic aneurysm. There is no gastrohepatic or hepatoduodenal ligament lymphadenopathy. No retroperitoneal or mesenteric lymphadenopathy. No pelvic sidewall lymphadenopathy.  Reproductive: Hysterectomy. There is a new 4.2 x 3.4 cm multicystic lesion identified in the left adnexal space, limited assessment and not adequately characterized on this noncontrast study.  Other: No intraperitoneal free fluid.  Musculoskeletal: No worrisome lytic or sclerotic osseous abnormality.  IMPRESSION: 1. New 4.2 x 3.4 cm multicystic lesion in the left adnexal space, limited assessment and not adequately characterized on this noncontrast study. Recommend prompt follow-up with pelvic ultrasound. Reference: JACR 2020 Feb;17(2):. 2. Otherwise, acute findings in the abdomen or pelvis. Specifically, no evidence for urinary stone disease.   Electronically Signed By: Eric  Mansell M.D. On: 10/29/2022 16:38   

## 2023-01-13 ENCOUNTER — Other Ambulatory Visit: Payer: Self-pay | Admitting: Gynecologic Oncology

## 2023-01-13 DIAGNOSIS — R1900 Intra-abdominal and pelvic swelling, mass and lump, unspecified site: Secondary | ICD-10-CM

## 2023-01-13 NOTE — Progress Notes (Signed)
Patient here for new patient consultation with Dr. Clide Cliff and for a pre-operative appointment prior to her scheduled surgery on Jan 20, 2023. She is scheduled for robotic assisted bilateral salpingo-oophorectomy, possible staging. The surgery was discussed in detail.  See after visit summary for additional details.       Discussed post-op pain management in detail including the aspects of the enhanced recovery pathway.  She currently takes hydrocodone/APAP. We discussed the use of tylenol post-op and to monitor for a maximum of 4,000 mg in a 24 hour period.  Also prescribed sennakot to be used after surgery and to hold if having loose stools.  Discussed bowel regimen in detail.     Discussed the use of SCDs and measures to take at home to prevent DVT including frequent mobility.  Reportable signs and symptoms of DVT discussed. Post-operative instructions discussed and expectations for after surgery. Incisional care discussed as well including reportable signs and symptoms including erythema, drainage, wound separation.     10 minutes spent with the patient/preparing information.  Verbalizing understanding of material discussed. No needs or concerns voiced at the end of the visit.   Advised patient to call for any needs.    This appointment is included in the global surgical bundle as pre-operative teaching and has no charge.

## 2023-01-13 NOTE — Patient Instructions (Signed)
Preparing for your Surgery  Plan for surgery on Jan 20, 2023 with Dr. Meredith Newton at [facility]. You will be scheduled for robotic assisted bilateral salpingo-oophorectomy (removal of both ovaries and fallopian tubes), possible staging,  any any other indicated procedures.   Plan to have a CEA level tumor marker drawn today.  We have also placed a referral to genetics.   Pre-operative Testing -You will receive a phone call from presurgical testing at [facility] to arrange for a pre-operative appointment and lab work.  -Bring your insurance card, copy of an advanced directive if applicable, medication list  -At that visit, you will be asked to sign a consent for a possible blood transfusion in case a transfusion becomes necessary during surgery.  The need for a blood transfusion is rare but having consent is a necessary part of your care.     -You should not be taking blood thinners or aspirin at least ten days prior to surgery unless instructed by your surgeon.  -Do not take supplements such as fish oil (omega 3), red yeast rice, turmeric before your surgery. You want to avoid medications with aspirin in them including headache powders such as BC or Goody's), Excedrin migraine.  Day Before Surgery at Home -You will be asked to take in a light diet the day before surgery. You will be advised you can have clear liquids up until 3 hours before your surgery.    Eat a light diet the day before surgery.  Examples including soups, broths, toast, yogurt, mashed potatoes.  AVOID GAS PRODUCING FOODS AND BEVERAGES. Things to avoid include carbonated beverages (fizzy beverages, sodas), raw fruits and raw vegetables (uncooked), or beans.   If your bowels are filled with gas, your surgeon will have difficulty visualizing your pelvic organs which increases your surgical risks.  Your role in recovery Your role is to become active as soon as directed by your doctor, while still  giving yourself time to heal.  Rest when you feel tired. You will be asked to do the following in order to speed your recovery:  - Cough and breathe deeply. This helps to clear and expand your lungs and can prevent pneumonia after surgery.  - STAY ACTIVE WHEN YOU GET HOME. Do mild physical activity. Walking or moving your legs help your circulation and body functions return to normal. Do not try to get up or walk alone the first time after surgery.   -If you develop swelling on one leg or the other, pain in the back of your leg, redness/warmth in one of your legs, please call the office or go to the Emergency Room to have a doppler to rule out a blood clot. For shortness of breath, chest pain-seek care in the Emergency Room as soon as possible. - Actively manage your pain. Managing your pain lets you move in comfort. We will ask you to rate your pain on a scale of zero to 10. It is your responsibility to tell your doctor or nurse where and how much you hurt so your pain can be treated.  Special Considerations -If you are diabetic, you may be placed on insulin after surgery to have closer control over your blood sugars to promote healing and recovery.  This does not mean that you will be discharged on insulin.  If applicable, your oral antidiabetics will be resumed when you are tolerating a solid diet.  -Your final pathology results from surgery should be available around one week after   surgery and the results will be relayed to you when available.  -Dr. Lisa Jackson-Moore is the surgeon that assists your GYN Oncologist with surgery.  If you end up staying the night, the next day after your surgery you will either see Dr. Tucker, Dr. Newton, or Dr. Lisa Jackson-Moore.  -FMLA forms can be faxed to [phone] and please allow 5-7 business days for completion.  Pain Management After Surgery -Make sure that you have Tylenol and Ibuprofen IF YOU ARE ABLE TO TAKE THESE MEDICATIONS at home to use on a  regular basis after surgery for pain control. We recommend alternating the medications every hour to six hours since they work differently and are processed in the body differently for pain relief.  -Review the attached handout on narcotic use and their risks and side effects.   Bowel Regimen -You have been prescribed Sennakot-S to take nightly to prevent constipation especially if you are taking the narcotic pain medication intermittently.  It is important to prevent constipation and drink adequate amounts of liquids. You can stop taking this medication when you are not taking pain medication and you are back on your normal bowel routine.  Risks of Surgery Risks of surgery are low but include bleeding, infection, damage to surrounding structures, re-operation, blood clots, and very rarely death.   Blood Transfusion Information (For the consent to be signed before surgery)  We will be checking your blood type before surgery so in case of emergencies, we will know what type of blood you would need.                                            WHAT IS A BLOOD TRANSFUSION?  A transfusion is the replacement of blood or some of its parts. Blood is made up of multiple cells which provide different functions. Red blood cells carry oxygen and are used for blood loss replacement. White blood cells fight against infection. Platelets control bleeding. Plasma helps clot blood. Other blood products are available for specialized needs, such as hemophilia or other clotting disorders. BEFORE THE TRANSFUSION  Who gives blood for transfusions?  You may be able to donate blood to be used at a later date on yourself (autologous donation). Relatives can be asked to donate blood. This is generally not any safer than if you have received blood from a stranger. The same precautions are taken to ensure safety when a relative's blood is donated. Healthy volunteers who are fully evaluated to make sure their blood is  safe. This is blood bank blood. Transfusion therapy is the safest it has ever been in the practice of medicine. Before blood is taken from a donor, a complete history is taken to make sure that person has no history of diseases nor engages in risky social behavior (examples are intravenous drug use or sexual activity with multiple partners). The donor's travel history is screened to minimize risk of transmitting infections, such as malaria. The donated blood is tested for signs of infectious diseases, such as HIV and hepatitis. The blood is then tested to be sure it is compatible with you in order to minimize the chance of a transfusion reaction. If you or a relative donates blood, this is often done in anticipation of surgery and is not appropriate for emergency situations. It takes many days to process the donated blood. RISKS AND COMPLICATIONS Although transfusion therapy   is very safe and saves many lives, the main dangers of transfusion include:  Getting an infectious disease. Developing a transfusion reaction. This is an allergic reaction to something in the blood you were given. Every precaution is taken to prevent this. The decision to have a blood transfusion has been considered carefully by your caregiver before blood is given. Blood is not given unless the benefits outweigh the risks.  AFTER SURGERY INSTRUCTIONS  Return to work: 4-6 weeks if applicable  Activity: 1. Be up and out of the bed during the day.  Take a nap if needed.  You may walk up steps but be careful and use the hand rail.  Stair climbing will tire you more than you think, you may need to stop part way and rest.   2. No lifting or straining for 6 weeks over 10 pounds. No pushing, pulling, straining for 6 weeks.  3. No driving for around 1 week(s).  Do not drive if you are taking narcotic pain medicine and make sure that your reaction time has returned.   4. You can shower as soon as the next day after surgery. Shower  daily.  Use your regular soap and water (not directly on the incision) and pat your incision(s) dry afterwards; don't rub.  No tub baths or submerging your body in water until cleared by your surgeon. If you have the soap that was given to you by pre-surgical testing that was used before surgery, you do not need to use it afterwards because this can irritate your incisions.   5. No sexual activity and nothing in the vagina for 4-6 weeks.  6. You may experience a small amount of clear drainage from your incisions, which is normal.  If the drainage persists, increases, or changes color please call the office.  7. Do not use creams, lotions, or ointments such as neosporin on your incisions after surgery until advised by your surgeon because they can cause removal of the dermabond glue on your incisions.    8. Take Tylenol or ibuprofen first for pain if you are able to take these medications and only use narcotic pain medication for severe pain not relieved by the Tylenol or Ibuprofen.  Monitor your Tylenol intake to a max of 4,000 mg in a 24 hour period. You can alternate these medications after surgery.  Diet: 1. Low sodium Heart Healthy Diet is recommended but you are cleared to resume your normal (before surgery) diet after your procedure.  2. It is safe to use a laxative, such as Miralax or Colace, if you have difficulty moving your bowels. You have been prescribed Sennakot-S to take at bedtime every evening after surgery to keep bowel movements regular and to prevent constipation.    Wound Care: 1. Keep clean and dry.  Shower daily.  Reasons to call the Doctor: Fever - Oral temperature greater than 100.4 degrees Fahrenheit Foul-smelling vaginal discharge Difficulty urinating Nausea and vomiting Increased pain at the site of the incision that is unrelieved with pain medicine. Difficulty breathing with or without chest pain New calf pain especially if only on one side Sudden, continuing  increased vaginal bleeding with or without clots.   Contacts: For questions or concerns you should contact:  Dr. Meredith Newton at [phone]  Jamari Moten, NP at [phone]  After Hours: call [phone] and have the GYN Oncologist paged/contacted (after 5 pm or on the weekends). You will speak with an after hours RN and let he or she know you   have had surgery.  Messages sent via mychart are for non-urgent matters and are not responded to after hours so for urgent needs, please call the after hours number.      

## 2023-01-13 NOTE — Patient Instructions (Signed)
DUE TO COVID-19 ONLY TWO VISITORS  (aged 51 and older)  ARE ALLOWED TO COME WITH YOU AND STAY IN THE WAITING ROOM ONLY DURING PRE OP AND PROCEDURE.   **NO VISITORS ARE ALLOWED IN THE SHORT STAY AREA OR RECOVERY ROOM!!**  IF YOU WILL BE ADMITTED INTO THE HOSPITAL YOU ARE ALLOWED ONLY FOUR SUPPORT PEOPLE DURING VISITATION HOURS ONLY (7 AM -8PM)   The support person(s) must pass our screening, gel in and out, and wear a mask at all times, including in the patient's room. Patients must also wear a mask when staff or their support person are in the room. Visitors GUEST BADGE MUST BE WORN VISIBLY  One adult visitor may remain with you overnight and MUST be in the room by 8 P.M.     Your procedure is scheduled on: 01/20/23   Report to [facility] Main Entrance    Report to admitting at : 12:00 PM   Call this number if you have problems the morning of surgery [phone]  Eat a light diet the day before surgery.  Examples including soups, broths, toast, yogurt, mashed potatoes.  Things to avoid include carbonated beverages (fizzy beverages), raw fruits and raw vegetables, or beans.   If your bowels are filled with gas, your surgeon will have difficulty visualizing your pelvic organs which increases your surgical risks.   Do not eat food :After Midnight.   After Midnight you may have the following liquids until : 11:00 AM DAY OF SURGERY  Water Black Coffee (sugar ok, NO MILK/CREAM OR CREAMERS)  Tea (sugar ok, NO MILK/CREAM OR CREAMERS) regular and decaf                             Plain Jell-O (NO RED)                                           Fruit ices (not with fruit pulp, NO RED)                                     Popsicles (NO RED)                                                                  Juice: apple, WHITE grape, WHITE cranberry Sports drinks like Gatorade (NO RED)              Oral Hygiene is also important to reduce your risk of infection.                                     Remember - BRUSH YOUR TEETH THE MORNING OF SURGERY WITH YOUR REGULAR TOOTHPASTE  DENTURES WILL BE REMOVED PRIOR TO SURGERY PLEASE DO NOT APPLY "Poly grip" OR ADHESIVES!!!   Do NOT smoke after Midnight   Take these medicines the morning of surgery with A SIP OF WATER: amlodipine,levothyroxine,cephalexin.Sumatriptan as needed.  You may not have any metal on your body including hair pins, jewelry, and body piercing             Do not wear make-up, lotions, powders, perfumes/cologne, or deodorant  Do not wear nail polish including gel and S&S, artificial/acrylic nails, or any other type of covering on natural nails including finger and toenails. If you have artificial nails, gel coating, etc. that needs to be removed by a nail salon please have this removed prior to surgery or surgery may need to be canceled/ delayed if the surgeon/ anesthesia feels like they are unable to be safely monitored.   Do not shave  48 hours prior to surgery.   Do not bring valuables to the hospital. [facility] IS NOT             RESPONSIBLE   FOR VALUABLES.   Contacts, glasses, or bridgework may not be worn into surgery.   Bring small overnight bag day of surgery.   DO NOT BRING YOUR HOME MEDICATIONS TO THE HOSPITAL. PHARMACY WILL DISPENSE MEDICATIONS LISTED ON YOUR MEDICATION LIST TO YOU DURING YOUR ADMISSION IN THE HOSPITAL!    Patients discharged on the day of surgery will not be allowed to drive home.  Someone NEEDS to stay with you for the first 24 hours after anesthesia.   Special Instructions: Bring a copy of your healthcare power of attorney and living will documents         the day of surgery if you haven't scanned them before.              Please read over the following fact sheets you were given: IF YOU HAVE QUESTIONS ABOUT YOUR PRE-OP INSTRUCTIONS PLEASE CALL [phone]    [facility] - Preparing for Surgery Before surgery, you can play an important role.   Because skin is not sterile, your skin needs to be as free of germs as possible.  You can reduce the number of germs on your skin by washing with CHG (chlorahexidine gluconate) soap before surgery.  CHG is an antiseptic cleaner which kills germs and bonds with the skin to continue killing germs even after washing. Please DO NOT use if you have an allergy to CHG or antibacterial soaps.  If your skin becomes reddened/irritated stop using the CHG and inform your nurse when you arrive at Short Stay. Do not shave (including legs and underarms) for at least 48 hours prior to the first CHG shower.  You may shave your face/neck. Please follow these instructions carefully:  1.  Shower with CHG Soap the night before surgery and the  morning of Surgery.  2.  If you choose to wash your hair, wash your hair first as usual with your  normal  shampoo.  3.  After you shampoo, rinse your hair and body thoroughly to remove the  shampoo.                           4.  Use CHG as you would any other liquid soap.  You can apply chg directly  to the skin and wash                       Gently with a scrungie or clean washcloth.  5.  Apply the CHG Soap to your body ONLY FROM THE NECK DOWN.   Do not use on face/ open  Wound or open sores. Avoid contact with eyes, ears mouth and genitals (private parts).                       Wash face,  Genitals (private parts) with your normal soap.             6.  Wash thoroughly, paying special attention to the area where your surgery  will be performed.  7.  Thoroughly rinse your body with warm water from the neck down.  8.  DO NOT shower/wash with your normal soap after using and rinsing off  the CHG Soap.                9.  Pat yourself dry with a clean towel.            10.  Wear clean pajamas.            11.  Place clean sheets on your bed the night of your first shower and do not  sleep with pets. Day of Surgery : Do not apply any lotions/deodorants the  morning of surgery.  Please wear clean clothes to the hospital/surgery center.  FAILURE TO FOLLOW THESE INSTRUCTIONS [place] RESULT IN THE CANCELLATION OF YOUR SURGERY PATIENT SIGNATURE_________________________________  NURSE SIGNATURE__________________________________  ________________________________________________________________________ WHAT IS A BLOOD TRANSFUSION? Blood Transfusion Information  A transfusion is the replacement of blood or some of its parts. Blood is made up of multiple cells which provide different functions. Red blood cells carry oxygen and are used for blood loss replacement. White blood cells fight against infection. Platelets control bleeding. Plasma helps clot blood. Other blood products are available for specialized needs, such as hemophilia or other clotting disorders. BEFORE THE TRANSFUSION  Who gives blood for transfusions?  Healthy volunteers who are fully evaluated to make sure their blood is safe. This is blood bank blood. Transfusion therapy is the safest it has ever been in the practice of medicine. Before blood is taken from a donor, a complete history is taken to make sure that person has no history of diseases nor engages in risky social behavior (examples are intravenous drug use or sexual activity with multiple partners). The donor's travel history is screened to minimize risk of transmitting infections, such as malaria. The donated blood is tested for signs of infectious diseases, such as HIV and hepatitis. The blood is then tested to be sure it is compatible with you in order to minimize the chance of a transfusion reaction. If you or a relative donates blood, this is often done in anticipation of surgery and is not appropriate for emergency situations. It takes many days to process the donated blood. RISKS AND COMPLICATIONS Although transfusion therapy is very safe and saves many lives, the main dangers of transfusion include:  Getting an infectious  disease. Developing a transfusion reaction. This is an allergic reaction to something in the blood you were given. Every precaution is taken to prevent this. The decision to have a blood transfusion has been considered carefully by your caregiver before blood is given. Blood is not given unless the benefits outweigh the risks. AFTER THE TRANSFUSION Right after receiving a blood transfusion, you will usually feel much better and more energetic. This is especially true if your red blood cells have gotten low (anemic). The transfusion raises the level of the red blood cells which carry oxygen, and this usually causes an energy increase. The nurse administering the transfusion will monitor you carefully for complications.  HOME CARE INSTRUCTIONS  No special instructions are needed after a transfusion. You may find your energy is better. Speak with your caregiver about any limitations on activity for underlying diseases you may have. SEEK MEDICAL CARE IF:  Your condition is not improving after your transfusion. You develop redness or irritation at the intravenous (IV) site. SEEK IMMEDIATE MEDICAL CARE IF:  Any of the following symptoms occur over the next 12 hours: Shaking chills. You have a temperature by mouth above 102 F (38.9 C), not controlled by medicine. Chest, back, or muscle pain. People around you feel you are not acting correctly or are confused. Shortness of breath or difficulty breathing. Dizziness and fainting. You get a rash or develop hives. You have a decrease in urine output. Your urine turns a dark color or changes to pink, red, or brown. Any of the following symptoms occur over the next 10 days: You have a temperature by mouth above 102 F (38.9 C), not controlled by medicine. Shortness of breath. Weakness after normal activity. The white part of the eye turns yellow (jaundice). You have a decrease in the amount of urine or are urinating less often. Your urine turns a  dark color or changes to pink, red, or brown. Document Released: 08/29/2000 Document Revised: 11/24/2011 Document Reviewed: 04/17/2008 [facility] Patient Information 2014 [place], [place].  _______________________________________________________________________

## 2023-01-14 ENCOUNTER — Other Ambulatory Visit: Payer: Self-pay

## 2023-01-14 ENCOUNTER — Encounter (HOSPITAL_COMMUNITY): Payer: Self-pay

## 2023-01-14 ENCOUNTER — Encounter (HOSPITAL_COMMUNITY)
Admission: RE | Admit: 2023-01-14 | Discharge: 2023-01-14 | Disposition: A | Discharge status: Home / Self Care | Payer: Medicaid Other | Source: Ambulatory Visit | Attending: Psychiatry | Admitting: Psychiatry

## 2023-01-14 VITALS — BP 118/79 | HR 70 | Temp 97.7°F | Ht 64.0 in | Wt 157.0 lb

## 2023-01-14 DIAGNOSIS — Z01818 Encounter for other preprocedural examination: Secondary | ICD-10-CM | POA: Diagnosis present

## 2023-01-14 DIAGNOSIS — I10 Essential (primary) hypertension: Secondary | ICD-10-CM | POA: Diagnosis not present

## 2023-01-14 DIAGNOSIS — R1900 Intra-abdominal and pelvic swelling, mass and lump, unspecified site: Secondary | ICD-10-CM | POA: Diagnosis not present

## 2023-01-14 HISTORY — DX: Personal history of urinary calculi: Z87.442

## 2023-01-14 HISTORY — DX: Unspecified osteoarthritis, unspecified site: M19.90

## 2023-01-14 HISTORY — DX: Anemia, unspecified: D64.9

## 2023-01-14 HISTORY — DX: Hypothyroidism, unspecified: E03.9

## 2023-01-14 LAB — COMPREHENSIVE METABOLIC PANEL
ALT: 17 U/L (ref 0–44)
AST: 14 U/L — ABNORMAL LOW (ref 15–41)
Albumin: 3.8 g/dL (ref 3.5–5.0)
Alkaline Phosphatase: 57 U/L (ref 38–126)
Anion gap: 9 (ref 5–15)
BUN: 20 mg/dL (ref 6–20)
CO2: 22 mmol/L (ref 22–32)
Calcium: 8.9 mg/dL (ref 8.9–10.3)
Chloride: 103 mmol/L (ref 98–111)
Creatinine, Ser: 1.02 mg/dL — ABNORMAL HIGH (ref 0.44–1.00)
GFR, Estimated: >60 mL/min (ref >60)
Glucose, Bld: 80 mg/dL (ref 70–99)
Potassium: 3.8 mmol/L (ref 3.5–5.1)
Sodium: 134 mmol/L — ABNORMAL LOW (ref 135–145)
Total Bilirubin: 0.6 mg/dL (ref 0.3–1.2)
Total Protein: 6.7 g/dL (ref 6.5–8.1)

## 2023-01-14 LAB — CBC
HCT: 44.6 % (ref 36.0–46.0)
Hemoglobin: 14.8 g/dL (ref 12.0–15.0)
MCH: 32.3 pg (ref 26.0–34.0)
MCHC: 33.2 g/dL (ref 30.0–36.0)
MCV: 97.4 fL (ref 80.0–100.0)
Platelets: 162 10*3/uL (ref 150–400)
RBC: 4.58 MIL/uL (ref 3.87–5.11)
RDW: 13.5 % (ref 11.5–15.5)
WBC: 8.1 10*3/uL (ref 4.0–10.5)
nRBC: 0 % (ref 0.0–0.2)

## 2023-01-14 LAB — TYPE AND SCREEN

## 2023-01-14 NOTE — Progress Notes (Signed)
For Short Stay: COVID SWAB appointment date:  Bowel Prep reminder: N/A   For Anesthesia: PCP - Erskine Speed: FNP Cardiologist - N/A  Chest x-ray -  EKG - 01/14/23 Stress Test -  ECHO -  Cardiac Cath -  Pacemaker/ICD device last checked: Pacemaker orders received: Device Rep notified:  Spinal Cord Stimulator: n/A  Sleep Study - Yes CPAP - NO  Fasting Blood Sugar - N/A Checks Blood Sugar _____ times a day Date and result of last Hgb A1c-  Last dose of GLP1 agonist- N/A GLP1 instructions:   Last dose of SGLT-2 inhibitors-  SGLT-2 instructions:   Blood Thinner Instructions: N/A Aspirin Instructions: Last Dose:  Activity level: Can go up a flight of stairs and activities of daily living without stopping and without chest pain and/or shortness of breath   Able to exercise without chest pain and/or shortness of breath    Anesthesia review: Hx: HTN  Patient denies shortness of breath, fever, cough and chest pain at PAT appointment   Patient verbalized understanding of instructions that were given to them at the PAT appointment. Patient was also instructed that they will need to review over the PAT instructions again at home before surgery.

## 2023-01-15 ENCOUNTER — Encounter: Payer: Self-pay | Admitting: Psychiatry

## 2023-01-19 ENCOUNTER — Telehealth: Payer: Self-pay

## 2023-01-19 NOTE — Telephone Encounter (Signed)
Telephone call to check on pre-operative status.  Patient compliant with pre-operative instructions.  Reinforced nothing to eat after midnight. Clear liquids until 1030. Patient to arrive at 1130.  No questions or concerns voiced.  Instructed to call for any needs.  °

## 2023-01-20 ENCOUNTER — Ambulatory Visit (HOSPITAL_BASED_OUTPATIENT_CLINIC_OR_DEPARTMENT_OTHER): Payer: Medicaid Other | Admitting: Anesthesiology

## 2023-01-20 ENCOUNTER — Encounter (HOSPITAL_COMMUNITY)
Admission: RE | Disposition: A | Discharge status: Home / Self Care | Payer: Self-pay | Source: Home / Self Care | Attending: Psychiatry

## 2023-01-20 ENCOUNTER — Encounter (HOSPITAL_COMMUNITY): Payer: Self-pay | Admitting: Psychiatry

## 2023-01-20 ENCOUNTER — Ambulatory Visit (HOSPITAL_COMMUNITY)
Admission: RE | Admit: 2023-01-20 | Discharge: 2023-01-20 | Disposition: A | Discharge status: Home / Self Care | Payer: Medicaid Other | Attending: Psychiatry | Admitting: Psychiatry

## 2023-01-20 ENCOUNTER — Other Ambulatory Visit: Payer: Self-pay

## 2023-01-20 ENCOUNTER — Ambulatory Visit (HOSPITAL_COMMUNITY): Payer: Medicaid Other | Admitting: Anesthesiology

## 2023-01-20 DIAGNOSIS — E039 Hypothyroidism, unspecified: Secondary | ICD-10-CM | POA: Insufficient documentation

## 2023-01-20 DIAGNOSIS — I10 Essential (primary) hypertension: Secondary | ICD-10-CM | POA: Diagnosis not present

## 2023-01-20 DIAGNOSIS — R1900 Intra-abdominal and pelvic swelling, mass and lump, unspecified site: Secondary | ICD-10-CM

## 2023-01-20 DIAGNOSIS — M1990 Unspecified osteoarthritis, unspecified site: Secondary | ICD-10-CM | POA: Insufficient documentation

## 2023-01-20 DIAGNOSIS — Z79899 Other long term (current) drug therapy: Secondary | ICD-10-CM | POA: Diagnosis not present

## 2023-01-20 DIAGNOSIS — E785 Hyperlipidemia, unspecified: Secondary | ICD-10-CM | POA: Diagnosis not present

## 2023-01-20 DIAGNOSIS — N83201 Unspecified ovarian cyst, right side: Secondary | ICD-10-CM

## 2023-01-20 DIAGNOSIS — Z87442 Personal history of urinary calculi: Secondary | ICD-10-CM | POA: Diagnosis not present

## 2023-01-20 DIAGNOSIS — R978 Other abnormal tumor markers: Secondary | ICD-10-CM | POA: Diagnosis not present

## 2023-01-20 DIAGNOSIS — N736 Female pelvic peritoneal adhesions (postinfective): Secondary | ICD-10-CM | POA: Diagnosis not present

## 2023-01-20 DIAGNOSIS — N83202 Unspecified ovarian cyst, left side: Secondary | ICD-10-CM | POA: Insufficient documentation

## 2023-01-20 DIAGNOSIS — F32A Depression, unspecified: Secondary | ICD-10-CM | POA: Diagnosis not present

## 2023-01-20 DIAGNOSIS — N838 Other noninflammatory disorders of ovary, fallopian tube and broad ligament: Secondary | ICD-10-CM

## 2023-01-20 DIAGNOSIS — K219 Gastro-esophageal reflux disease without esophagitis: Secondary | ICD-10-CM | POA: Diagnosis not present

## 2023-01-20 DIAGNOSIS — F419 Anxiety disorder, unspecified: Secondary | ICD-10-CM | POA: Diagnosis not present

## 2023-01-20 DIAGNOSIS — D638 Anemia in other chronic diseases classified elsewhere: Secondary | ICD-10-CM

## 2023-01-20 HISTORY — PX: ROBOTIC ASSISTED SALPINGO OOPHERECTOMY: SHX6082

## 2023-01-20 LAB — ABO/RH: ABO/RH(D): B POS

## 2023-01-20 LAB — TYPE AND SCREEN
ABO/RH(D): B POS
Antibody Screen: NEGATIVE

## 2023-01-20 SURGERY — SALPINGO-OOPHORECTOMY, ROBOT-ASSISTED
Anesthesia: General | Laterality: Bilateral

## 2023-01-20 MED ORDER — FENTANYL CITRATE PF 50 MCG/ML IJ SOSY: 25.0000 ug | PREFILLED_SYRINGE | INTRAMUSCULAR | Status: DC | PRN

## 2023-01-20 MED ORDER — ONDANSETRON HCL 4 MG/2ML IJ SOLN
INTRAMUSCULAR | Status: DC | PRN
  Administered 2023-01-20: 4 mg via INTRAVENOUS

## 2023-01-20 MED ORDER — DEXAMETHASONE SODIUM PHOSPHATE 4 MG/ML IJ SOLN
4.0000 mg | INTRAMUSCULAR | Status: AC
  Administered 2023-01-20: 5 mg via INTRAVENOUS

## 2023-01-20 MED ORDER — FENTANYL CITRATE (PF) 250 MCG/5ML IJ SOLN
INTRAMUSCULAR | Status: AC
  Filled 2023-01-20: qty 5

## 2023-01-20 MED ORDER — AMISULPRIDE (ANTIEMETIC) 5 MG/2ML IV SOLN
10.0000 mg | Freq: Once | INTRAVENOUS | Status: AC | PRN
  Administered 2023-01-20: 10 mg via INTRAVENOUS

## 2023-01-20 MED ORDER — ACETAMINOPHEN 500 MG PO TABS
1000.0000 mg | ORAL_TABLET | ORAL | Status: AC
  Administered 2023-01-20: 1000 mg via ORAL
  Filled 2023-01-20: qty 2

## 2023-01-20 MED ORDER — ONDANSETRON HCL 4 MG/2ML IJ SOLN
INTRAMUSCULAR | Status: AC
  Filled 2023-01-20: qty 2

## 2023-01-20 MED ORDER — DEXAMETHASONE SODIUM PHOSPHATE 10 MG/ML IJ SOLN
INTRAMUSCULAR | Status: AC
  Filled 2023-01-20: qty 1

## 2023-01-20 MED ORDER — OXYCODONE HCL 5 MG PO TABS
5.0000 mg | ORAL_TABLET | Freq: Once | ORAL | Status: AC | PRN
  Administered 2023-01-20: 5 mg via ORAL

## 2023-01-20 MED ORDER — HYDROCODONE-ACETAMINOPHEN 10-325 MG PO TABS: 1.0000 | ORAL_TABLET | Freq: Four times a day (QID) | ORAL | 0 refills | Status: DC | PRN

## 2023-01-20 MED ORDER — LIDOCAINE HCL (PF) 2 % IJ SOLN
INTRAMUSCULAR | Status: AC
  Filled 2023-01-20: qty 5

## 2023-01-20 MED ORDER — ROCURONIUM BROMIDE 10 MG/ML (PF) SYRINGE
PREFILLED_SYRINGE | INTRAVENOUS | Status: AC
  Filled 2023-01-20: qty 10

## 2023-01-20 MED ORDER — ROCURONIUM BROMIDE 100 MG/10ML IV SOLN
INTRAVENOUS | Status: DC | PRN
  Administered 2023-01-20: 50 mg via INTRAVENOUS
  Administered 2023-01-20: 30 mg via INTRAVENOUS

## 2023-01-20 MED ORDER — BUPIVACAINE HCL 0.25 % IJ SOLN
INTRAMUSCULAR | Status: AC
  Filled 2023-01-20: qty 1

## 2023-01-20 MED ORDER — OXYCODONE HCL 5 MG/5ML PO SOLN: 5.0000 mg | Freq: Once | ORAL | Status: AC | PRN

## 2023-01-20 MED ORDER — PROPOFOL 10 MG/ML IV BOLUS
INTRAVENOUS | Status: AC
  Filled 2023-01-20: qty 20

## 2023-01-20 MED ORDER — FENTANYL CITRATE (PF) 250 MCG/5ML IJ SOLN
INTRAMUSCULAR | Status: DC | PRN
  Administered 2023-01-20 (×4): 50 ug via INTRAVENOUS

## 2023-01-20 MED ORDER — PROPOFOL 10 MG/ML IV BOLUS
INTRAVENOUS | Status: DC | PRN
  Administered 2023-01-20: 150 mg via INTRAVENOUS

## 2023-01-20 MED ORDER — OXYCODONE HCL 5 MG PO TABS
ORAL_TABLET | ORAL | Status: AC
  Filled 2023-01-20: qty 1

## 2023-01-20 MED ORDER — KETOROLAC TROMETHAMINE 15 MG/ML IJ SOLN: 15.0000 mg | INTRAMUSCULAR | Status: DC

## 2023-01-20 MED ORDER — ORAL CARE MOUTH RINSE: 15.0000 mL | Freq: Once | OROMUCOSAL | Status: AC

## 2023-01-20 MED ORDER — SUCCINYLCHOLINE CHLORIDE 200 MG/10ML IV SOSY
PREFILLED_SYRINGE | INTRAVENOUS | Status: DC | PRN
  Administered 2023-01-20: 100 mg via INTRAVENOUS

## 2023-01-20 MED ORDER — DEXMEDETOMIDINE HCL IN NACL 200 MCG/50ML IV SOLN
INTRAVENOUS | Status: DC | PRN
  Administered 2023-01-20: 8 ug via INTRAVENOUS
  Administered 2023-01-20 (×2): 4 ug via INTRAVENOUS

## 2023-01-20 MED ORDER — HYDROCODONE-ACETAMINOPHEN 10-325 MG PO TABS: 1.0000 | ORAL_TABLET | Freq: Four times a day (QID) | ORAL | 0 refills | Status: AC | PRN

## 2023-01-20 MED ORDER — GABAPENTIN 300 MG PO CAPS
300.0000 mg | ORAL_CAPSULE | ORAL | Status: AC
  Administered 2023-01-20: 300 mg via ORAL
  Filled 2023-01-20: qty 1

## 2023-01-20 MED ORDER — LACTATED RINGERS IV SOLN: INTRAVENOUS | Status: DC | PRN

## 2023-01-20 MED ORDER — DEXMEDETOMIDINE HCL IN NACL 80 MCG/20ML IV SOLN
INTRAVENOUS | Status: AC
  Filled 2023-01-20: qty 20

## 2023-01-20 MED ORDER — CHLORHEXIDINE GLUCONATE 0.12 % MT SOLN
15.0000 mL | Freq: Once | OROMUCOSAL | Status: AC
  Administered 2023-01-20: 15 mL via OROMUCOSAL

## 2023-01-20 MED ORDER — MIDAZOLAM HCL 5 MG/5ML IJ SOLN
INTRAMUSCULAR | Status: DC | PRN
  Administered 2023-01-20: 2 mg via INTRAVENOUS

## 2023-01-20 MED ORDER — AMISULPRIDE (ANTIEMETIC) 5 MG/2ML IV SOLN
INTRAVENOUS | Status: AC
  Filled 2023-01-20: qty 4

## 2023-01-20 MED ORDER — LACTATED RINGERS IV SOLN: INTRAVENOUS | Status: DC

## 2023-01-20 MED ORDER — FENTANYL CITRATE (PF) 100 MCG/2ML IJ SOLN
INTRAMUSCULAR | Status: DC | PRN
  Administered 2023-01-20 (×2): 50 ug via INTRAVENOUS
  Administered 2023-01-20: 100 ug via INTRAVENOUS
  Administered 2023-01-20: 50 ug via INTRAVENOUS

## 2023-01-20 MED ORDER — LACTATED RINGERS IR SOLN
Status: DC | PRN
  Administered 2023-01-20: 1000 mL

## 2023-01-20 MED ORDER — BUPIVACAINE HCL 0.25 % IJ SOLN
INTRAMUSCULAR | Status: DC | PRN
  Administered 2023-01-20: 24 mL

## 2023-01-20 MED ORDER — LIDOCAINE HCL (CARDIAC) PF 100 MG/5ML IV SOSY
PREFILLED_SYRINGE | INTRAVENOUS | Status: DC | PRN
  Administered 2023-01-20: 60 mg via INTRAVENOUS

## 2023-01-20 MED ORDER — HEPARIN SODIUM (PORCINE) 5000 UNIT/ML IJ SOLN
5000.0000 [IU] | INTRAMUSCULAR | Status: AC
  Administered 2023-01-20: 5000 [IU] via SUBCUTANEOUS
  Filled 2023-01-20: qty 1

## 2023-01-20 MED ORDER — SUGAMMADEX SODIUM 200 MG/2ML IV SOLN
INTRAVENOUS | Status: DC | PRN
  Administered 2023-01-20: 300 mg via INTRAVENOUS

## 2023-01-20 MED ORDER — STERILE WATER FOR IRRIGATION IR SOLN
Status: DC | PRN
  Administered 2023-01-20: 1000 mL

## 2023-01-20 MED ORDER — SCOPOLAMINE 1 MG/3DAYS TD PT72
1.0000 | MEDICATED_PATCH | TRANSDERMAL | Status: DC
  Administered 2023-01-20: 1.5 mg via TRANSDERMAL
  Filled 2023-01-20: qty 1

## 2023-01-20 MED ORDER — MIDAZOLAM HCL 2 MG/2ML IJ SOLN
INTRAMUSCULAR | Status: AC
  Filled 2023-01-20: qty 2

## 2023-01-20 MED ORDER — HEMOSTATIC AGENTS (NO CHARGE) OPTIME
TOPICAL | Status: DC | PRN
  Administered 2023-01-20: 1 via TOPICAL

## 2023-01-20 SURGICAL SUPPLY — 79 items
ADH SKN CLS APL DERMABOND .7 (GAUZE/BANDAGES/DRESSINGS) ×2
AGENT HMST KT MTR STRL THRMB (HEMOSTASIS) ×2
APL ESCP 34 STRL LF DISP (HEMOSTASIS) ×2
APPLICATOR SURGIFLO ENDO (HEMOSTASIS) ×1 IMPLANT
BAG LAPAROSCOPIC 12 15 PORT 16 (BASKET) IMPLANT
BAG RETRIEVAL 12/15 (BASKET)
BLADE SURG SZ10 CARB STEEL (BLADE) IMPLANT
COVER BACK TABLE 60X90IN (DRAPES) ×2 IMPLANT
COVER TIP SHEARS 8 DVNC (MISCELLANEOUS) ×2 IMPLANT
DERMABOND ADVANCED .7 DNX12 (GAUZE/BANDAGES/DRESSINGS) ×2 IMPLANT
DRAPE ARM DVNC X/XI (DISPOSABLE) ×8 IMPLANT
DRAPE COLUMN DVNC XI (DISPOSABLE) ×2 IMPLANT
DRAPE SHEET LG 3/4 BI-LAMINATE (DRAPES) ×2 IMPLANT
DRAPE SURG IRRIG POUCH 19X23 (DRAPES) ×2 IMPLANT
DRIVER NDL MEGA 8 DVNC XI (INSTRUMENTS) ×2 IMPLANT
DRIVER NDLE MEGA DVNC XI (INSTRUMENTS) ×4 IMPLANT
DRSG OPSITE POSTOP 4X6 (GAUZE/BANDAGES/DRESSINGS) IMPLANT
DRSG OPSITE POSTOP 4X8 (GAUZE/BANDAGES/DRESSINGS) IMPLANT
ELECT PENCIL ROCKER SW 15FT (MISCELLANEOUS) IMPLANT
ELECT REM PT RETURN 15FT ADLT (MISCELLANEOUS) ×2 IMPLANT
FORCEPS BPLR FENES DVNC XI (FORCEP) ×2 IMPLANT
FORCEPS PROGRASP DVNC XI (FORCEP) ×1 IMPLANT
GAUZE 4X4 16PLY ~~LOC~~+RFID DBL (SPONGE) ×1 IMPLANT
GLOVE BIO SURGEON STRL SZ 6 (GLOVE) ×8 IMPLANT
GLOVE BIO SURGEON STRL SZ 6.5 (GLOVE) ×2 IMPLANT
GLOVE BIOGEL PI IND STRL 6.5 (GLOVE) ×4 IMPLANT
GOWN STRL REUS W/ TWL LRG LVL3 (GOWN DISPOSABLE) ×8 IMPLANT
GOWN STRL REUS W/TWL LRG LVL3 (GOWN DISPOSABLE) ×8
GRASPER SUT TROCAR 14GX15 (MISCELLANEOUS) ×1 IMPLANT
HOLDER FOLEY CATH W/STRAP (MISCELLANEOUS) IMPLANT
IRRIG SUCT STRYKERFLOW 2 WTIP (MISCELLANEOUS) ×2
IRRIGATION SUCT STRKRFLW 2 WTP (MISCELLANEOUS) ×2 IMPLANT
KIT PROCEDURE DVNC SI (MISCELLANEOUS) IMPLANT
KIT TURNOVER KIT A (KITS) IMPLANT
LIGASURE IMPACT 36 18CM CVD LR (INSTRUMENTS) IMPLANT
MANIPULATOR ADVINCU DEL 3.0 PL (MISCELLANEOUS) IMPLANT
MANIPULATOR ADVINCU DEL 3.5 PL (MISCELLANEOUS) IMPLANT
MANIPULATOR UTERINE 4.5 ZUMI (MISCELLANEOUS) IMPLANT
NDL HYPO 21X1.5 SAFETY (NEEDLE) ×1 IMPLANT
NDL INSUFFLATION 14GA 120MM (NEEDLE) IMPLANT
NDL SPNL 18GX3.5 QUINCKE PK (NEEDLE) IMPLANT
NEEDLE HYPO 21X1.5 SAFETY (NEEDLE) ×2 IMPLANT
NEEDLE INSUFFLATION 14GA 120MM (NEEDLE) IMPLANT
NEEDLE SPNL 18GX3.5 QUINCKE PK (NEEDLE) IMPLANT
OBTURATOR OPTICAL STND 8 DVNC (TROCAR) ×2
OBTURATOR OPTICALSTD 8 DVNC (TROCAR) ×2 IMPLANT
PACK ROBOT GYN CUSTOM WL (TRAY / TRAY PROCEDURE) ×2 IMPLANT
PAD ARMBOARD 7.5X6 YLW CONV (MISCELLANEOUS) ×2 IMPLANT
PAD POSITIONING PINK XL (MISCELLANEOUS) ×2 IMPLANT
PORT ACCESS TROCAR AIRSEAL 12 (TROCAR) ×1 IMPLANT
SCISSORS MNPLR CVD DVNC XI (INSTRUMENTS) ×2 IMPLANT
SCRUB CHG 4% DYNA-HEX 4OZ (MISCELLANEOUS) ×3 IMPLANT
SEAL UNIV 5-12 XI (MISCELLANEOUS) ×6 IMPLANT
SET TRI-LUMEN FLTR TB AIRSEAL (TUBING) ×2 IMPLANT
SPIKE FLUID TRANSFER (MISCELLANEOUS) ×2 IMPLANT
SPONGE T-LAP 18X18 ~~LOC~~+RFID (SPONGE) IMPLANT
SURGIFLO W/THROMBIN 8M KIT (HEMOSTASIS) ×1 IMPLANT
SUT MNCRL AB 4-0 PS2 18 (SUTURE) IMPLANT
SUT PDS AB 1 TP1 54 (SUTURE) IMPLANT
SUT VIC AB 0 CT1 27 (SUTURE)
SUT VIC AB 0 CT1 27XBRD ANTBC (SUTURE) IMPLANT
SUT VIC AB 2-0 CT1 27 (SUTURE)
SUT VIC AB 2-0 CT1 TAPERPNT 27 (SUTURE) IMPLANT
SUT VIC AB 4-0 PS2 18 (SUTURE) ×4 IMPLANT
SUT VICRYL 0 27 CT2 27 ABS (SUTURE) IMPLANT
SUT VLOC 180 0 9IN  GS21 (SUTURE)
SUT VLOC 180 0 9IN GS21 (SUTURE) IMPLANT
SYR 10ML LL (SYRINGE) IMPLANT
SYS BAG RETRIEVAL 10MM (BASKET) ×4
SYS WOUND ALEXIS 18CM MED (MISCELLANEOUS)
SYSTEM BAG RETRIEVAL 10MM (BASKET) ×2 IMPLANT
SYSTEM WOUND ALEXIS 18CM MED (MISCELLANEOUS) IMPLANT
TOWEL OR NON WOVEN STRL DISP B (DISPOSABLE) IMPLANT
TRAP SPECIMEN MUCUS 40CC (MISCELLANEOUS) ×1 IMPLANT
TRAY FOLEY MTR SLVR 16FR STAT (SET/KITS/TRAYS/PACK) ×2 IMPLANT
TROCAR PORT AIRSEAL 5X120 (TROCAR) IMPLANT
UNDERPAD 30X36 HEAVY ABSORB (UNDERPADS AND DIAPERS) ×4 IMPLANT
WATER STERILE IRR 1000ML POUR (IV SOLUTION) ×1 IMPLANT
YANKAUER SUCT BULB TIP 10FT TU (MISCELLANEOUS) IMPLANT

## 2023-01-20 NOTE — Anesthesia Postprocedure Evaluation (Signed)
Anesthesia Post Note  Patient: International aid/development worker  Procedure(s) Performed: XI ROBOTIC ASSISTED BILATERAL SALPINGO OOPHORECTOMY (Bilateral)     Patient location during evaluation: PACU Anesthesia Type: General Level of consciousness: awake Pain management: pain level controlled Vital Signs Assessment: post-procedure vital signs reviewed and stable Respiratory status: spontaneous breathing, nonlabored ventilation and respiratory function stable Cardiovascular status: blood pressure returned to baseline and stable Postop Assessment: no apparent nausea or vomiting Anesthetic complications: no   No notable events documented.  Last Vitals:  Vitals:   01/20/23 1845 01/20/23 1915  BP: (!) 135/90   Pulse: 69 77  Resp: 19   Temp: 36.5 C   SpO2: 99%     Last Pain:  Vitals:   01/20/23 1845  TempSrc:   PainSc: 0-No pain                 Linton Rump

## 2023-01-20 NOTE — Discharge Instructions (Addendum)
AFTER SURGERY INSTRUCTIONS   Return to work: 4-6 weeks if applicable   Activity: 1. Be up and out of the bed during the day.  Take a nap if needed.  You may walk up steps but be careful and use the hand rail.  Stair climbing will tire you more than you think, you may need to stop part way and rest.    2. No lifting or straining for 6 weeks over 10 pounds. No pushing, pulling, straining for 6 weeks.   3. No driving for around 1 week(s).  Do not drive if you are taking narcotic pain medicine and make sure that your reaction time has returned.    4. You can shower as soon as the next day after surgery. Shower daily.  Use your regular soap and water (not directly on the incision) and pat your incision(s) dry afterwards; don't rub.  No tub baths or submerging your body in water until cleared by your surgeon. If you have the soap that was given to you by pre-surgical testing that was used before surgery, you do not need to use it afterwards because this can irritate your incisions.    5. No sexual activity and nothing in the vagina for 4-6 weeks.   6. You may experience a small amount of clear drainage from your incisions, which is normal.  If the drainage persists, increases, or changes color please call the office.   7. Do not use creams, lotions, or ointments such as neosporin on your incisions after surgery until advised by your surgeon because they can cause removal of the dermabond glue on your incisions.     8. Take Tylenol or ibuprofen first for pain if you are able to take these medications and only use narcotic pain medication for severe pain not relieved by the Tylenol or Ibuprofen.  Monitor your Tylenol intake to a max of 4,000 mg in a 24 hour period. You can alternate these medications after surgery.   Diet: 1. Low sodium Heart Healthy Diet is recommended but you are cleared to resume your normal (before surgery) diet after your procedure.   2. It is safe to use a laxative, such as  Miralax or Colace, if you have difficulty moving your bowels. You have been prescribed Sennakot-S to take at bedtime every evening after surgery to keep bowel movements regular and to prevent constipation.     Wound Care: 1. Keep clean and dry.  Shower daily.   Reasons to call the Doctor: Fever - Oral temperature greater than 100.4 degrees Fahrenheit Foul-smelling vaginal discharge Difficulty urinating Nausea and vomiting Increased pain at the site of the incision that is unrelieved with pain medicine. Difficulty breathing with or without chest pain New calf pain especially if only on one side Sudden, continuing increased vaginal bleeding with or without clots.   Contacts: For questions or concerns you should contact:   Dr. Meredith Newton at [phone]   Melissa Cross, NP at [phone]   After Hours: call [phone] and have the GYN Oncologist paged/contacted (after 5 pm or on the weekends). You will speak with an after hours RN and let he or she know you have had surgery.   Messages sent via mychart are for non-urgent matters and are not responded to after hours so for urgent needs, please call the after hours number.   

## 2023-01-20 NOTE — Op Note (Signed)
GYNECOLOGIC ONCOLOGY OPERATIVE NOTE  Date of Service: 01/20/2023  Preoperative Diagnosis: Complex left ovarian mass, elevated ROMA  Postoperative Diagnosis: Left paratubal cyst, left hemorrhagic ovarian cyst, right simple ovarian cysts  Procedures: Robotic assisted bilateral salpingo oophorectomy (Modifer 22: extensive adhesions of the sigmoid and rectum to the left pelvic sidewall and left adnexa requiring additional careful dissection, increasing duration of the procedure by 30 minutes)  Surgeon: Clide Cliff, MD  Assistants: Antionette Char, MD and (an MD assistant was necessary for tissue manipulation, management of robotic instrumentation, retraction and positioning due to the complexity of the case and hospital policies)  Anesthesia: General  Estimated Blood Loss: 75 mL    Fluids: 1000 ml, crystalloid  Urine Output: 300 ml, clear yellow  Findings: On entry to abdomen, normal upper abdominal survey with smooth diaphragm, liver, stomach and normal appearing omentum and bowel.  Adhesions of the small bowel and colon to the right paracolic gutter.  Extensive adhesions of the sigmoid and rectum to the left paracolic gutter and left pelvic sidewall, initially occluding view of the left adnexa.  Epiploica of the rectum adherent to the vaginal cuff.  Filshie clips present bilaterally.  Surgical staples from prior surgery present in the pelvis.  Left ovary with cystic component and paratubal cysts.  Right ovary with simple appearing cysts.  IOFS the left ovary consistent with hemorrhagic cyst.   Specimens:  ID Type Source Tests Collected by Time Destination  1 : Left Tube and Ovary Tissue PATH Gyn tumor resection SURGICAL PATHOLOGY Clide Cliff, MD 01/20/2023 1630   2 : Right tube and ovary GYN Fallopian Tube and Ovary, Right SURGICAL PATHOLOGY Clide Cliff, MD 01/20/2023 1708   A : Pelvic Washings Body Fluid PATH Cytology Pelvic Washing CYTOLOGY - NON PAP Clide Cliff, MD  01/20/2023 1553     Complications:  None  Indications for Procedure: Sue Schmidt is a 52 y.o. woman who imaging showing a complex left adnexal mass and elevated ROMA, CEA.  Prior to the procedure, all risks, benefits, and alternatives were discussed and informed surgical consent was signed.  Procedure: Patient was taken to the operating room where general anesthesia was achieved.  She was positioned in dorsal lithotomy and prepped and draped.  A foley catheter was inserted into the bladder.    A 12 mm incision was made in the left upper quadrant near Palmer's point.  The abdomen was entered with a 5 mm OptiView trocar under direct visualization.  The abdomen was insufflated, the patient placed in steep Trendelenburg, and additional trocars were placed as follows: an 8mm robotic trocar superior to the umbilicus, one 8 mm robotic trocar in the right abdomen, and one 8 mm robotic trocar in the left abdomen.  The left upper quadrant trocar was removed and replaced with a 12 mm airseal trocar.  All trocars were placed under direct visualization.  The bowels were moved into the upper abdomen.  The DaVinci robotic surgical system was brought to the patient's bedside and docked.  Pelvic washings were collected.  Adhesions of the sigmoid colon to the left pelvic sidewall were lysed with scissors and blunt dissection.  With a portion of the left adnexa visualized, the remnant left round ligament was cauterized and transected and the retroperitoneum entered.  Given adhesions, exposure of the retroperitoneum was initially limited and the ureter was not initially visualized.  Additional time was spent meticulously dissecting additional adhesions of the sigmoid and rectum from the left pelvic sidewall and left adnexa.  With  the colon mobilized, the retroperitoneum was further opened and the left ureter identified. The left infundibulopelvic ligament was isolated, cauterized, and transected.  The broad ligament was  incised to free the left adnexa along the length of the pelvic sidewall peritoneum.  The freed left tube and ovary were placed in an Endo Catch bag and brought up through the left upper quadrant incision.  Specimen was drained and partially morcellated in the bag out of the incision and removed.  The bag was intact without spillage.  The specimen was handed off the field for frozen pathology.  Attention was then turned to the right.  The remnant right round ligament was cauterized and transected and the retroperitoneum entered.  The right ureter was identified.  The right infundibulopelvic ligament was isolated, cauterized, and transected.  The adnexa was elevated and the remaining peritoneal attachments were transected.  The right tube and ovary were placed in an Endo Catch bag for later removal.  The pelvis was irrigated.  Floseal was placed in the bilateral retroperitoneal spaces.  All operative sites were found to be hemostatic.  All instruments were removed and the robot was taken from the patient's bedside.  The Endo Catch bag was removed through the left upper quadrant incision.  The right cysts were drained in the bag during removal.  The fascia at the 12 mm incision was closed with 0 Vicryl with the assistance of a PMI device. The abdomen was desufflated and all ports were removed. The skin at all incisions was closed with 4-0 Vicryl to reapproximate the subcutaneous tissue and 4-0 monocryl in a subcuticular fashion followed by surgical glue.  Patient tolerated the procedure well. Sponge, lap, and instrument counts were correct.  No perioperative antibiotic prophylaxis was indicated for this procedure.  She was extubated and taken to the PACU in stable condition.  Clide Cliff, MD Gynecologic Oncology

## 2023-01-20 NOTE — Anesthesia Procedure Notes (Signed)
Date/Time: 01/20/2023 5:29 PM  Performed by: Minerva Ends, CRNAOxygen Delivery Method: Simple face mask Placement Confirmation: positive ETCO2 and breath sounds checked- equal and bilateral Dental Injury: Teeth and Oropharynx as per pre-operative assessment

## 2023-01-20 NOTE — Transfer of Care (Signed)
Immediate Anesthesia Transfer of Care Note  Patient: International aid/development worker  Procedure(s) Performed: XI ROBOTIC ASSISTED BILATERAL SALPINGO OOPHORECTOMY (Bilateral)  Patient Location: PACU  Anesthesia Type:General  Level of Consciousness: sedated  Airway & Oxygen Therapy: Patient Spontanous Breathing and Patient connected to face mask oxygen  Post-op Assessment: Report given to RN and Post -op Vital signs reviewed and stable  Post vital signs: Reviewed and stable  Last Vitals:  Vitals Value Taken Time  BP 146/87 01/20/23 1746  Temp 36.6 C 01/20/23 1746  Pulse 71 01/20/23 1756  Resp 18 01/20/23 1756  SpO2 100 % 01/20/23 1756  Vitals shown include unvalidated device data.  Last Pain:  Vitals:   01/20/23 1746  TempSrc:   PainSc: 7       Patients Stated Pain Goal: 4 (01/20/23 1201)  Complications: No notable events documented.

## 2023-01-20 NOTE — Anesthesia Procedure Notes (Signed)
Procedure Name: Intubation Date/Time: 01/20/2023 3:18 PM  Performed by: Johnette Abraham, CRNAPre-anesthesia Checklist: Patient identified, Emergency Drugs available, Suction available and Patient being monitored Patient Re-evaluated:Patient Re-evaluated prior to induction Oxygen Delivery Method: Circle System Utilized Preoxygenation: Pre-oxygenation with 100% oxygen Induction Type: IV induction Ventilation: Mask ventilation without difficulty Laryngoscope Size: Mac and 3 Grade View: Grade I Tube type: Oral Tube size: 7.0 mm Number of attempts: 1 Airway Equipment and Method: Stylet and Oral airway Placement Confirmation: ETT inserted through vocal cords under direct vision, positive ETCO2 and breath sounds checked- equal and bilateral Secured at: 22 cm Tube secured with: Tape Dental Injury: Teeth and Oropharynx as per pre-operative assessment

## 2023-01-20 NOTE — Anesthesia Preprocedure Evaluation (Addendum)
Anesthesia Evaluation  Patient identified by MRN, date of birth, ID band Patient awake    Reviewed: Allergy & Precautions, NPO status , Patient's Chart, lab work & pertinent test results  History of Anesthesia Complications Negative for: history of anesthetic complications  Airway Mallampati: III  TM Distance: >3 FB Neck ROM: Full    Dental  (+) Dental Advisory Given,    Pulmonary neg shortness of breath, sleep apnea (has CPAP but does not use it) , neg COPD, neg recent URI   Pulmonary exam normal breath sounds clear to auscultation       Cardiovascular hypertension (amlodipine, losartan), Pt. on medications (-) angina (-) Past MI, (-) Cardiac Stents and (-) CABG (-) dysrhythmias  Rhythm:Regular Rate:Normal  HLD   Neuro/Psych  Headaches, neg Seizures PSYCHIATRIC DISORDERS Anxiety Depression       GI/Hepatic Neg liver ROS,GERD  Medicated,,  Endo/Other  Hypothyroidism    Renal/GU Renal disease (nephrolithiasis)     Musculoskeletal  (+) Arthritis ,    Abdominal   Peds  Hematology  (+) Blood dyscrasia, anemia   Anesthesia Other Findings 52 y.o. woman with 3.5cm cystic mass in the left ovary, LLQ pain and normal CA125 but elevated HE4.  Reproductive/Obstetrics                             Anesthesia Physical Anesthesia Plan  ASA: 3  Anesthesia Plan: General   Post-op Pain Management: Tylenol PO (pre-op)*   Induction: Intravenous  PONV Risk Score and Plan: 3 and Ondansetron, Dexamethasone, Scopolamine patch - Pre-op and Treatment may vary due to age or medical condition  Airway Management Planned: Oral ETT  Additional Equipment:   Intra-op Plan:   Post-operative Plan: Extubation in OR  Informed Consent: I have reviewed the patients History and Physical, chart, labs and discussed the procedure including the risks, benefits and alternatives for the proposed anesthesia with the patient  or authorized representative who has indicated his/her understanding and acceptance.     Dental advisory given  Plan Discussed with: CRNA and Anesthesiologist  Anesthesia Plan Comments: (Risks of general anesthesia discussed including, but not limited to, sore throat, hoarse voice, chipped/damaged teeth, injury to vocal cords, nausea and vomiting, allergic reactions, lung infection, heart attack, stroke, and death. All questions answered. )        Anesthesia Quick Evaluation

## 2023-01-20 NOTE — Interval H&P Note (Signed)
History and Physical Interval Note:  01/20/2023 2:14 PM  Sue Schmidt  has presented today for surgery, with the diagnosis of pelvic mass.  The various methods of treatment have been discussed with the patient and family. After consideration of risks, benefits and other options for treatment, the patient has consented to  Procedure(s): XI ROBOTIC ASSISTED BILATERAL SALPINGO OOPHORECTOMY (Bilateral) POSSIBLE STAGING (N/A) as a surgical intervention.  The patient's history has been reviewed, patient examined, no change in status, stable for surgery.  I have reviewed the patient's chart and labs.  Questions were answered to the patient's satisfaction.     [name]

## 2023-01-21 ENCOUNTER — Encounter (HOSPITAL_COMMUNITY): Payer: Self-pay | Admitting: Psychiatry

## 2023-01-22 LAB — CYTOLOGY - NON PAP

## 2023-01-22 LAB — SURGICAL PATHOLOGY

## 2023-01-26 ENCOUNTER — Ambulatory Visit: Payer: Medicaid Other

## 2023-02-05 ENCOUNTER — Inpatient Hospital Stay: Payer: Medicaid Other | Attending: Psychiatry | Admitting: Genetic Counselor

## 2023-02-05 ENCOUNTER — Inpatient Hospital Stay: Payer: Medicaid Other

## 2023-02-05 ENCOUNTER — Other Ambulatory Visit: Payer: Self-pay | Admitting: Genetic Counselor

## 2023-02-05 ENCOUNTER — Encounter: Payer: Self-pay | Admitting: Genetic Counselor

## 2023-02-05 DIAGNOSIS — Z8041 Family history of malignant neoplasm of ovary: Secondary | ICD-10-CM | POA: Diagnosis not present

## 2023-02-05 DIAGNOSIS — Z1379 Encounter for other screening for genetic and chromosomal anomalies: Secondary | ICD-10-CM

## 2023-02-05 DIAGNOSIS — Z803 Family history of malignant neoplasm of breast: Secondary | ICD-10-CM | POA: Diagnosis not present

## 2023-02-05 LAB — GENETIC SCREENING ORDER

## 2023-02-05 NOTE — Progress Notes (Signed)
REFERRING PROVIDER: Clide Cliff, MD [address] [place],  [place] [address]   PRIMARY PROVIDER:  Oneal Grout, FNP  PRIMARY REASON FOR VISIT:  Encounter Diagnoses  Name Primary?   Family history of ovarian cancer Yes   Family history of breast cancer      HISTORY OF PRESENT ILLNESS:   Sue Schmidt, a 53 y.o. female, was seen for a [facility] [place] cancer genetics consultation at the request of Dr. Pricilla Holm due to a family history of ovarian cancer.  Sue Schmidt presents to clinic today to discuss the possibility of a hereditary predisposition to cancer, to discuss genetic testing, and to further clarify her future cancer risks, as well as potential cancer risks for family members.   Sue Schmidt is a 51 y.o. female with no personal history of cancer.  She recently underwent bilateral oophorectomy-salpingectomy due to left cystic mass of ovary.  No malignancy was identified.    CANCER HISTORY:  Oncology History   No history exists.     RISK FACTORS:  Mammogram within the last year: no Number of breast biopsies: 0. Colonoscopy: yes;  January 2024; 7 sessile serrated polyps without dysplasia; f/u planned in 3 years . Hysterectomy: yes ~2019/2020 due to abnormal bleeding Ovaries intact: no; s/p BSO May 2024 Menarche was at age 83.  First live birth at age 75.  HRT use: 0 years. Dermatology screening: no  Past Medical History:  Diagnosis Date   Anemia    Anxiety and depression    Arthritis    History of kidney stones    Hypertension    Hypothyroidism    Mental disorder    Migraines    Thyroid disease    Vitamin D deficiency     Past Surgical History:  Procedure Laterality Date   BIOPSY  10/07/2022   Procedure: BIOPSY;  Surgeon: Lanelle Bal, DO;  [facility];  Service: Endoscopy;;   CHOLECYSTECTOMY     Laparoscopic   COLONOSCOPY WITH PROPOFOL N/A 10/07/2022   Procedure: COLONOSCOPY WITH PROPOFOL;  Surgeon: Lanelle Bal, DO;  [facility];  Service: Endoscopy;  Laterality: N/A;  1:15 pm   CYSTOSCOPY WITH RETROGRADE PYELOGRAM, URETEROSCOPY AND STENT PLACEMENT Right 01/31/2021   Procedure: CYSTOSCOPY WITH RIGHT RETROGRADE PYELOGRAM, RIGHT URETEROSCOPY AND RIGHT URETERAL STENT PLACEMENT;  Surgeon: Malen Gauze, MD;  Location: [facility];  Service: Urology;  Laterality: Right;   ESOPHAGOGASTRODUODENOSCOPY (EGD) WITH PROPOFOL N/A 10/07/2022   Procedure: ESOPHAGOGASTRODUODENOSCOPY (EGD) WITH PROPOFOL;  Surgeon: Lanelle Bal, DO;  [facility];  Service: Endoscopy;  Laterality: N/A;   LAPAROSCOPIC HYSTERECTOMY  2019   AUB   POLYPECTOMY  10/07/2022   Procedure: POLYPECTOMY;  Surgeon: Lanelle Bal, DO;  [facility];  Service: Endoscopy;;   ROBOTIC ASSISTED SALPINGO OOPHERECTOMY Bilateral 01/20/2023   Procedure: XI ROBOTIC ASSISTED BILATERAL SALPINGO OOPHORECTOMY;  Surgeon: Clide Cliff, MD;  Location: [facility];  Service: Gynecology;  Laterality: Bilateral;   STONE EXTRACTION WITH BASKET Right 01/31/2021   Procedure: STONE EXTRACTION WITH BASKET;  Surgeon: Malen Gauze, MD;  Location: [facility];  Service: Urology;  Laterality: Right;   TUBAL LIGATION  1997   FAMILY HISTORY:  We obtained a detailed, 4-generation family history.  Significant diagnoses are listed below: Family History  Problem Relation Age of Onset   Prostate cancer Father        dx unknown age   Ovarian cancer Maternal Aunt  dx 39s   Lung cancer Maternal Uncle        dx after 31; smoking hx   Breast cancer Paternal Aunt        x2 pat aunts; dx before age 63   Ovarian cancer Maternal Grandmother        dx after 71   Breast cancer Maternal Grandmother        bilateral; dx after 40   Breast cancer Paternal Grandmother        dx after 37     Sue Schmidt is unaware of previous family history of genetic testing for hereditary cancer risks. Other relatives/affected relatives are unavailable for genetic testing at  this time. There is no reported Ashkenazi Jewish ancestry. There is no known consanguinity.  GENETIC COUNSELING ASSESSMENT: Sue Schmidt is a 53 y.o. female with both a maternal and paternal family history which is somewhat suggestive of a hereditary cancer syndrome and predisposition to cancer given the presence of related cancers in multiple generations. We, therefore, discussed and recommended the following at today's visit.   DISCUSSION: We discussed that 5 - 10% of cancer is hereditary, with most cases of hereditary breast and ovarian cancer associated with mutations in BRCA1/2.  There are other genes that can be associated with hereditary breast or ovarian cancer syndromes.  We discussed that testing is beneficial for several reasons, including knowing about other cancer risks, identifying potential screening and risk-reduction options that may be appropriate, and to understanding if other family members could be at risk for cancer and allowing them to undergo genetic testing.  We reviewed the characteristics, features and inheritance patterns of hereditary cancer syndromes. We also discussed genetic testing, including the appropriate family members to test, the process of testing, insurance coverage and turn-around-time for results. We discussed the implications of a negative, positive, and variant of uncertain significant result. We discussed that negative results would be uninformative given that Sue Schmidt does not have a personal history of cancer. We recommended Sue Schmidt pursue genetic testing for a panel that contains genes associated with breast, ovarian, and other cancers.   The [facility] Common Hereditary Cancers + RNA Panel includes sequencing, deletion/duplication, and RNA analysis of the following 48 genes: APC, ATM, AXIN2, BAP1, BARD1, BMPR1A, BRCA1, BRCA2, BRIP1, CDH1, CDK4*, CDKN2A*, CHEK2, CTNNA1, DICER1, EPCAM* (del/dup only), FH, GREM1* (promoter dup analysis only), HOXB13*, KIT*,  MBD4*, MEN1, MLH1, MSH2, MSH3, MSH6, MUTYH, NF1, NTHL1, PALB2, PDGFRA*, PMS2, POLD1, POLE, PTEN, RAD51C, RAD51D, SDHA (sequencing only), SDHB, SDHC, SDHD, SMAD4, SMARCA4, STK11, TP53, TSC1, TSC2, VHL.  *Genes without RNA analysis.   Based on Sue Schmidt's family history of cancer, she meets medical criteria for genetic testing. Despite that she meets criteria, she may still have an out of pocket cost. We discussed that if her out of pocket cost for testing is over $100, the laboratory should contact them to discuss self-pay options and/or patient pay assistance programs.   We discussed the Genetic Information Non-Discrimination Act (GINA) of 2008, which helps protect individuals against genetic discrimination based on their genetic test results.  It impacts both health insurance and employment.  With health insurance, it protects against genetic test results being used for increased premiums or policy termination. For employment, it protects against hiring, firing and promoting decisions based on genetic test results.  GINA does not apply to those in the [employer], those who work for companies with less than 15 employees, and new life insurance or long-term disability insurance policies.  Health  status due to a cancer diagnosis is not protected under GINA.  PLAN: After considering the risks, benefits, and limitations, Sue Schmidt provided informed consent to pursue genetic testing and the blood sample was sent to [facility] for analysis of the Common Hereditary Cancers +RNA Panel. Results should be available within approximately 3 weeks' time, at which point they will be disclosed by telephone to Sue Schmidt, as will any additional recommendations warranted by these results. Sue Schmidt will receive a summary of her genetic counseling visit and a copy of her results once available. This information will also be available in Epic.   Sue Schmidt questions were answered to her satisfaction today. Our  contact information was provided should additional questions or concerns arise. Thank you for the referral and allowing [place] to share in the care of your patient.   Rowdy Guerrini M. Rennie Plowman, MS, [facility] Genetic Counselor [email or web address] (P) [phone]   The patient was seen for a total of 40 minutes in face-to-face genetic counseling.  The patient was seen alone.  Drs. Pamelia Hoit and/or Mosetta Putt were available to discuss this case as needed.  _______________________________________________________________________ For Office Staff:  Number of people involved in session: 1 Was an Intern/ student involved with case: no

## 2023-02-11 ENCOUNTER — Inpatient Hospital Stay (HOSPITAL_BASED_OUTPATIENT_CLINIC_OR_DEPARTMENT_OTHER): Payer: Medicaid Other | Admitting: Psychiatry

## 2023-02-11 VITALS — BP 143/87 | HR 72 | Temp 98.0°F | Resp 18 | Ht 64.96 in | Wt 152.0 lb

## 2023-02-11 DIAGNOSIS — R1900 Intra-abdominal and pelvic swelling, mass and lump, unspecified site: Secondary | ICD-10-CM

## 2023-02-11 NOTE — Patient Instructions (Signed)
It was a pleasure to see you in clinic today. - Lifting restrictions lifted. Reintroduce regular activity gradual. - Okay to return to routine care.   Thank you very much for allowing me to provide care for you today.  I appreciate your confidence in choosing our Gynecologic Oncology team at [facility].  If you have any questions about your visit today please call our office or send [place] a MyChart message and we will get back to you as soon as possible.

## 2023-02-11 NOTE — Progress Notes (Signed)
Gynecologic Oncology Return Clinic Visit  Date of Service: 02/11/2023 Referring Provider: Duane Lope, MD   Assessment & Plan: Sue Schmidt is a 52 y.o. 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  Postop: - Pt recovering well from surgery and healing appropriately postoperatively - Intraoperative findings and pathology results reviewed. - Ongoing postoperative expectations reviewed.  - No more restrictions. Okay to reintroduce regular activity gradually.  - Okay to return to routine care.   RTC PRN.  Clide Cliff, MD Gynecologic Oncology    ----------------------- Reason for Visit: Postop   Interval History: Pt reports that she is recovering well from surgery. She is not having any pain. She is eating and drinking well. She is voiding without issue and having regular bowel movements.   Past Medical/Surgical History: Past Medical History:  Diagnosis Date   Anemia    Anxiety and depression    Arthritis    History of kidney stones    Hypertension    Hypothyroidism    Mental disorder    Migraines    Thyroid disease    Vitamin D deficiency     Past Surgical History:  Procedure Laterality Date   BIOPSY  10/07/2022   Procedure: BIOPSY;  Surgeon: Lanelle Bal, DO;  Location: [facility];  Service: Endoscopy;;   CHOLECYSTECTOMY     Laparoscopic   COLONOSCOPY WITH PROPOFOL N/A 10/07/2022   Procedure: COLONOSCOPY WITH PROPOFOL;  Surgeon: Lanelle Bal, DO;  Location: [facility];  Service: Endoscopy;  Laterality: N/A;  1:15 pm   CYSTOSCOPY WITH RETROGRADE PYELOGRAM, URETEROSCOPY AND STENT PLACEMENT Right 01/31/2021   Procedure: CYSTOSCOPY WITH RIGHT RETROGRADE PYELOGRAM, RIGHT URETEROSCOPY AND RIGHT URETERAL STENT PLACEMENT;  Surgeon: Malen Gauze, MD;  Location: [facility];  Service: Urology;  Laterality: Right;   ESOPHAGOGASTRODUODENOSCOPY (EGD) WITH PROPOFOL N/A 10/07/2022   Procedure:  ESOPHAGOGASTRODUODENOSCOPY (EGD) WITH PROPOFOL;  Surgeon: Lanelle Bal, DO;  Location: [facility];  Service: Endoscopy;  Laterality: N/A;   LAPAROSCOPIC HYSTERECTOMY  2019   AUB   POLYPECTOMY  10/07/2022   Procedure: POLYPECTOMY;  Surgeon: Lanelle Bal, DO;  Location: [facility];  Service: Endoscopy;;   ROBOTIC ASSISTED SALPINGO OOPHERECTOMY Bilateral 01/20/2023   Procedure: XI ROBOTIC ASSISTED BILATERAL SALPINGO OOPHORECTOMY;  Surgeon: Clide Cliff, MD;  Location: [facility];  Service: Gynecology;  Laterality: Bilateral;   STONE EXTRACTION WITH BASKET Right 01/31/2021   Procedure: STONE EXTRACTION WITH BASKET;  Surgeon: Malen Gauze, MD;  Location: [facility];  Service: Urology;  Laterality: Right;   TUBAL LIGATION  1997    Family History  Problem Relation Age of Onset   Hypertension Mother    Prostate cancer Father        dx unknown age   Suicidality Father    Ovarian cancer Maternal Aunt        dx 52s   Lung cancer Maternal Uncle        dx after 53; smoking hx   Breast cancer Paternal Aunt        x2 pat aunts; dx before age 80   Ovarian cancer Maternal Grandmother        dx after 79   Breast cancer Maternal Grandmother        bilateral; dx after 52   Breast cancer Paternal Grandmother        dx after 38   Suicidality Paternal Grandfather    Colon cancer Neg Hx  Colon polyps Neg Hx    Endometrial cancer Neg Hx    Pancreatic cancer Neg Hx     Social History   Socioeconomic History   Marital status: Single    Spouse name: Not on file   Number of children: Not on file   Years of education: Not on file   Highest education level: Not on file  Occupational History   Not on file  Tobacco Use   Smoking status: Never   Smokeless tobacco: Never  Vaping Use   Vaping Use: Never used  Substance and Sexual Activity   Alcohol use: Not Currently    Comment: Occ   Drug use: Not Currently    Types: Marijuana   Sexual activity: Yes    Birth  control/protection: Surgical    Comment: hyst  Other Topics Concern   Not on file  Social History Narrative   Not on file   Social Determinants of Health   Financial Resource Strain: Low Risk  (11/28/2022)   Overall Financial Resource Strain (CARDIA)    Difficulty of Paying Living Expenses: Not very hard  Food Insecurity: No Food Insecurity (01/09/2023)   Hunger Vital Sign    Worried About Running Out of Food in the Last Year: Never true    Ran Out of Food in the Last Year: Never true  Transportation Needs: No Transportation Needs (01/09/2023)   PRAPARE - Administrator, Civil Service (Medical): No    Lack of Transportation (Non-Medical): No  Physical Activity: Insufficiently Active (11/28/2022)   Exercise Vital Sign    Days of Exercise per Week: 3 days    Minutes of Exercise per Session: 30 min  Stress: No Stress Concern Present (11/28/2022)   [employer] of Occupational Health - Occupational Stress Questionnaire    Feeling of Stress : Only a little  Social Connections: Socially Isolated (11/28/2022)   Social Connection and Isolation Panel [NHANES]    Frequency of Communication with Friends and Family: Three times a week    Frequency of Social Gatherings with Friends and Family: Once a week    Attends Religious Services: Never    Database administrator or Organizations: No    Attends Banker Meetings: Never    Marital Status: Never married    Current Medications:  Current Outpatient Medications:    amLODipine (NORVASC) 10 MG tablet, Take 10 mg by mouth daily., Disp: , Rfl:    cetirizine (ZYRTEC) 10 MG tablet, Take 10 mg by mouth daily., Disp: , Rfl:    Cholecalciferol (TRUE VITAMIN D3) 1.25 MG (50000 UT) capsule, Take 50,000 Units by mouth daily., Disp: , Rfl:    HYDROcodone-acetaminophen (NORCO) 10-325 MG tablet, Take 1 tablet by mouth every 6 (six) hours as needed for up to 10 doses., Disp: 10 tablet, Rfl: 0   hydrOXYzine (ATARAX) 25 MG tablet,  Take 25 mg by mouth daily., Disp: , Rfl:    levothyroxine (SYNTHROID) 175 MCG tablet, Take 175 mcg by mouth daily., Disp: , Rfl:    losartan (COZAAR) 100 MG tablet, Take 100 mg by mouth daily., Disp: , Rfl:    pantoprazole (PROTONIX) 40 MG tablet, Take 40 mg by mouth daily., Disp: , Rfl:    phenazopyridine (PYRIDIUM) 95 MG tablet, Take 95 mg by mouth daily., Disp: , Rfl:    promethazine (PHENERGAN) 25 MG tablet, Take 1 tablet by mouth 4 (four) times daily as needed., Disp: , Rfl:    senna-docusate (SENOKOT-S) 8.6-50 MG tablet,  Take 2 tablets by mouth at bedtime. For AFTER surgery, do not take if having diarrhea, Disp: 30 tablet, Rfl: 0   SUMAtriptan (IMITREX) 100 MG tablet, Take 100 mg by mouth as needed for headache., Disp: , Rfl:   Review of Symptoms: Complete 10-system review is negative except as above in Interval History.  Physical Exam: BP (!) 143/87 (BP Location: Left Arm, Patient Position: Sitting)   Pulse 72   Temp 98 F (36.7 C) (Oral)   Resp 18   Ht 5' 4.96" (1.65 m)   Wt 152 lb (68.9 kg)   SpO2 100%   BMI 25.32 kg/m  General: Alert, oriented, no acute distress. HEENT: Normocephalic, atraumatic. Neck symmetric without masses. Sclera anicteric.  Chest: Normal work of breathing. Clear to auscultation bilaterally.   Cardiovascular: Regular rate and rhythm, no murmurs. Abdomen: Soft, nontender.  Normoactive bowel sounds.  No masses appreciated.  Well-healing incisions. Extremities: Grossly normal range of motion.  Warm, well perfused.  No edema bilaterally. Skin: No rashes or lesions noted.  Laboratory & Radiologic Studies: FINAL MICROSCOPIC DIAGNOSIS:   A. LEFT FALLOPIAN TUBE AND OVARY, SALPINGO-OOPHORECTOMY:  Benign hemorrhagic follicular/luteal cyst  Benign fallopian tube  Negative for malignancy   B. RIGHT FALLOPIAN TUBE AND OVARY, SALPINGO-OOPHORECTOMY:  Benign luteal cysts and cystic follicles  Tubo-ovarian serosal adhesions  Benign paratubal cysts  Benign  fallopian tube  Negative for malignancy

## 2023-02-17 ENCOUNTER — Other Ambulatory Visit (HOSPITAL_COMMUNITY): Payer: Self-pay | Admitting: Family Medicine

## 2023-02-17 DIAGNOSIS — Z1231 Encounter for screening mammogram for malignant neoplasm of breast: Secondary | ICD-10-CM

## 2023-02-18 ENCOUNTER — Encounter: Payer: Self-pay | Admitting: Psychiatry

## 2023-02-20 ENCOUNTER — Ambulatory Visit (HOSPITAL_COMMUNITY)
Admission: RE | Admit: 2023-02-20 | Discharge: 2023-02-20 | Disposition: A | Discharge status: Home / Self Care | Payer: Medicaid Other | Source: Ambulatory Visit | Attending: Family Medicine | Admitting: Family Medicine

## 2023-02-20 ENCOUNTER — Encounter (HOSPITAL_COMMUNITY): Payer: Self-pay

## 2023-02-20 ENCOUNTER — Inpatient Hospital Stay (HOSPITAL_COMMUNITY): Admission: RE | Admit: 2023-02-20 | Payer: Medicaid Other | Source: Ambulatory Visit

## 2023-02-20 DIAGNOSIS — Z1231 Encounter for screening mammogram for malignant neoplasm of breast: Secondary | ICD-10-CM | POA: Insufficient documentation

## 2023-02-23 ENCOUNTER — Encounter (HOSPITAL_COMMUNITY): Payer: Self-pay | Admitting: Family Medicine

## 2023-02-24 ENCOUNTER — Other Ambulatory Visit (HOSPITAL_COMMUNITY): Payer: Self-pay | Admitting: Family Medicine

## 2023-02-24 DIAGNOSIS — R928 Other abnormal and inconclusive findings on diagnostic imaging of breast: Secondary | ICD-10-CM

## 2023-02-27 ENCOUNTER — Encounter: Payer: Self-pay | Admitting: Genetic Counselor

## 2023-02-27 ENCOUNTER — Ambulatory Visit: Payer: Self-pay | Admitting: Genetic Counselor

## 2023-02-27 ENCOUNTER — Telehealth: Payer: Self-pay | Admitting: Genetic Counselor

## 2023-02-27 DIAGNOSIS — Z803 Family history of malignant neoplasm of breast: Secondary | ICD-10-CM

## 2023-02-27 DIAGNOSIS — Z8041 Family history of malignant neoplasm of ovary: Secondary | ICD-10-CM

## 2023-02-27 DIAGNOSIS — Z1379 Encounter for other screening for genetic and chromosomal anomalies: Secondary | ICD-10-CM

## 2023-02-27 NOTE — Telephone Encounter (Signed)
Disclosed negative genetics.    

## 2023-03-03 ENCOUNTER — Encounter (HOSPITAL_COMMUNITY): Payer: Self-pay

## 2023-03-03 ENCOUNTER — Ambulatory Visit (HOSPITAL_COMMUNITY)
Admission: RE | Admit: 2023-03-03 | Discharge: 2023-03-03 | Disposition: A | Discharge status: Home / Self Care | Payer: Medicaid Other | Source: Ambulatory Visit | Attending: Family Medicine | Admitting: Family Medicine

## 2023-03-03 DIAGNOSIS — R928 Other abnormal and inconclusive findings on diagnostic imaging of breast: Secondary | ICD-10-CM | POA: Insufficient documentation

## 2023-03-20 ENCOUNTER — Encounter: Payer: Self-pay | Admitting: Psychiatry

## 2023-03-29 NOTE — Progress Notes (Signed)
HPI:   [name] was previously seen in the [facility] due to a family history of breast and ovarian cancer and concerns regarding a hereditary predisposition to cancer.    [name] recent genetic test results were disclosed to her by telephone. These results and recommendations are discussed in more detail below.  CANCER HISTORY:  [name] is a 52 y.o. female with no personal history of cancer.  She recently underwent bilateral oophorectomy-salpingectomy due to left cystic mass of ovary.  No malignancy was identified.      FAMILY HISTORY:  We obtained a detailed, 4-generation family history.  Significant diagnoses are listed below:      Family History  Problem Relation Age of Onset   Prostate cancer Father          dx unknown age   Ovarian cancer Maternal Aunt          dx 13s   Lung cancer Maternal Uncle          dx after 31; smoking hx   Breast cancer Paternal Aunt          x2 pat aunts; dx before age 70   Ovarian cancer Maternal Grandmother          dx after 29   Breast cancer Maternal Grandmother          bilateral; dx after 77   Breast cancer Paternal Grandmother          dx after 46       [name] is unaware of previous family history of genetic testing for hereditary cancer risks. Other relatives/affected relatives are unavailable for genetic testing at this time. There is no reported Ashkenazi Jewish ancestry. There is no known consanguinity.  GENETIC TEST RESULTS:  The [facility] Common Hereditary Cancers +RNA Panel found no pathogenic mutations.    The [facility] Common Hereditary Cancers + RNA Panel includes sequencing, deletion/duplication, and RNA analysis of the following 48 genes: APC, ATM, AXIN2, BAP1, BARD1, BMPR1A, BRCA1, BRCA2, BRIP1, CDH1, CDK4*, CDKN2A*, CHEK2, CTNNA1, DICER1, EPCAM* (del/dup only), FH, GREM1* (promoter dup analysis only), HOXB13*, KIT*, MBD4*, MEN1, MLH1, MSH2, MSH3, MSH6, MUTYH, NF1, NTHL1, PALB2, PDGFRA*, PMS2,  POLD1, POLE, PTEN, RAD51C, RAD51D, SDHA (sequencing only), SDHB, SDHC, SDHD, SMAD4, SMARCA4, STK11, TP53, TSC1, TSC2, VHL.  *Genes without RNA analysis.  .   The test report has been scanned into EPIC and is located under the Molecular Pathology section of the Results Review tab.  A portion of the result report is included below for reference. Genetic testing reported out on February 15, 2023.      Even though a pathogenic variant was not identified, possible explanations for the cancer in the family may include: There may be no hereditary risk for cancer in the family. The cancers in [name] family may be sporadic/familial or due to other genetic and environmental factors. There may be a gene mutation in one of these genes that current testing methods cannot detect but that chance is small. There could be another gene that has not yet been discovered, or that we have not yet tested, that is responsible for the cancer diagnoses in the family.  It is also possible there is a hereditary cause for the cancer in the family that [name] did not inherit.   Therefore, it is important to remain in touch with cancer genetics in the future so that we can continue to offer [name] the most up to date  genetic testing.    ADDITIONAL GENETIC TESTING:   [name] genetic testing was fairly extensive.  If there are additional relevant genes identified to increase cancer risk that can be analyzed in the future, we would be happy to discuss and coordinate this testing at that time.      CANCER SCREENING RECOMMENDATIONS:  [name] test result is considered negative (normal).  This means that we have not identified a hereditary cause for her family history of cancer at this time.   An individual's cancer risk and medical management are not determined by genetic test results alone. Overall cancer risk assessment incorporates additional factors, including personal medical history, family history,  and any available genetic information that may result in a personalized plan for cancer prevention and surveillance. Therefore, it is recommended she continue to follow the cancer management and screening guidelines provided by her GYN and primary healthcare provider.  Her lifetime risk for breast cancer based on Tyrer-Cuzick risk model and reported personal/family history is 15.7%.  This is elevated compared to the general population but not in the 'high risk for breast cancer' category per NCCN guidelines (>20%).  This risk estimate can change over time and may be repeated to reflect new information in her personal or family history in the future.         RECOMMENDATIONS FOR FAMILY MEMBERS:   Since she did not inherit a identifiable mutation in a cancer predisposition gene included on this panel, her children could not have inherited a known mutation from her in one of these genes. Individuals in this family might be at some increased risk of developing cancer, over the general population risk, due to the family history of cancer.  Individuals in the family should notify their providers of the family history of cancer. We recommend women in this family have a yearly mammogram beginning at age 43, or 16 years younger than the earliest onset of cancer, an annual clinical breast exam, and perform monthly breast self-exams.  Risk models that take into account family history and hormonal history may be helpful in determining appropriate breast cancer screening options for family members.  Other members of the family may still carry a pathogenic variant in one of these genes that [name] did not inherit. Based on the family history, we recommend her aunts with a history of breast or ovarian cancer have genetic counseling and testing. [name] can let us know if we can be of any assistance in coordinating genetic counseling and/or testing for these family member.     FOLLOW-UP:  Cancer genetics is a  rapidly advancing field and it is possible that new genetic tests will be appropriate for her and/or her family members in the future. We encourage [name] to remain in contact with cancer genetics, so we can update her personal and family histories and let her know of advances in cancer genetics that may benefit this family.   Our contact number was provided.  She knows she is welcome to call us at anytime with additional questions or concerns.   Liesl Simons M. Rennie Plowman, MS, [facility] Genetic Counselor [email or web address] [phone]) [phone]

## 2023-04-21 IMAGING — US US RENAL
1 series · 14 of 25 positions shown · non-contrast
Comparison: Renal stone CT 01/30/2021

CLINICAL DATA: Nephrolithiasis

EXAM:
RENAL / URINARY TRACT ULTRASOUND COMPLETE

[Series 1: us renal · 14 of 51 slices shown]
[im 1/51]
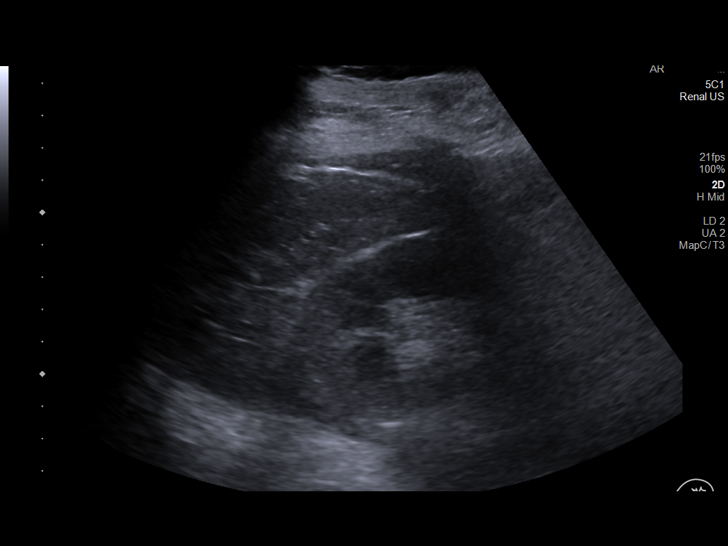
[im 5/51]
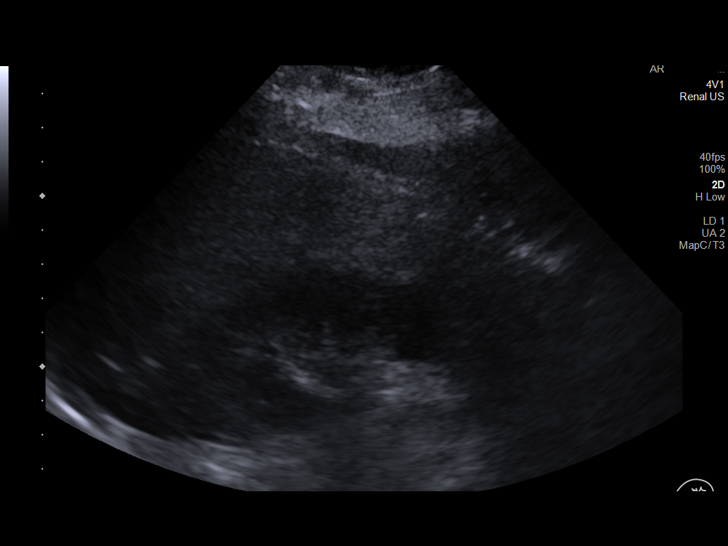
[im 9/51]
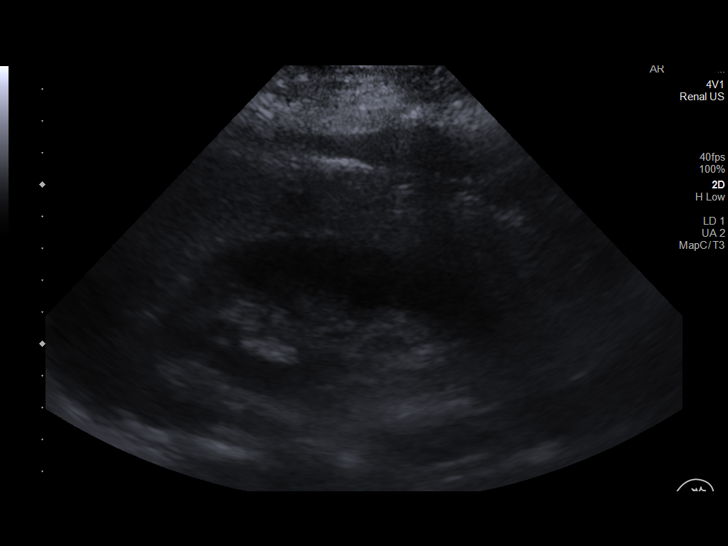
[im 13/51]
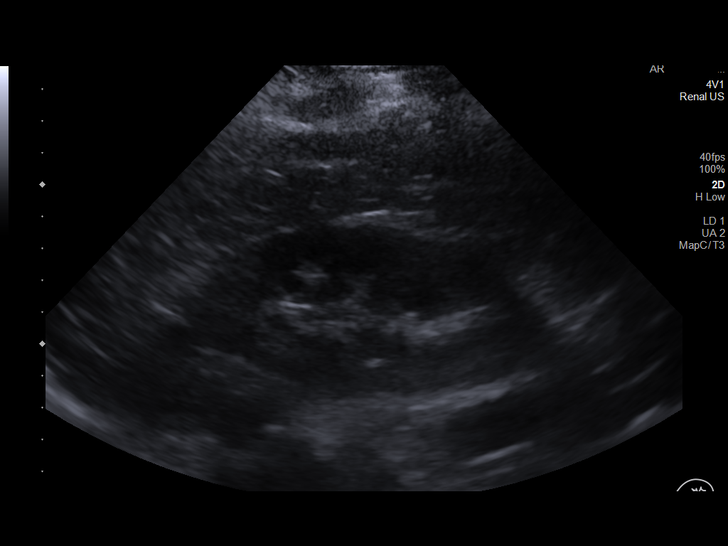
[im 17/51]
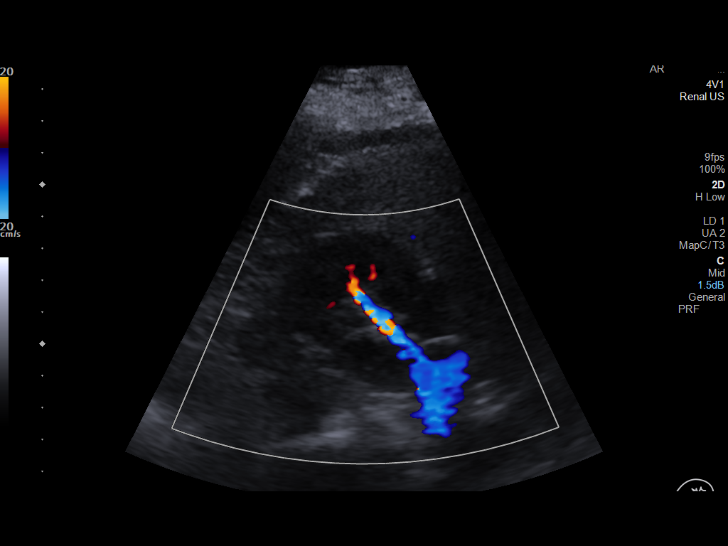
[im 19/51]
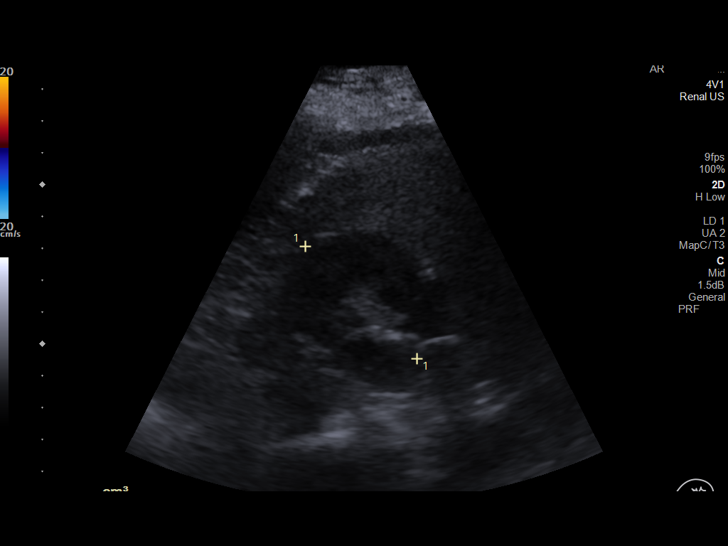
[im 23/51]
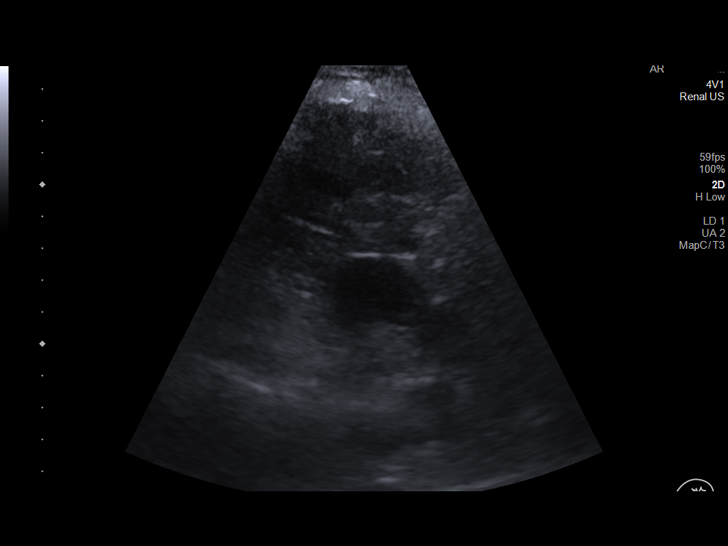
[im 28/51]
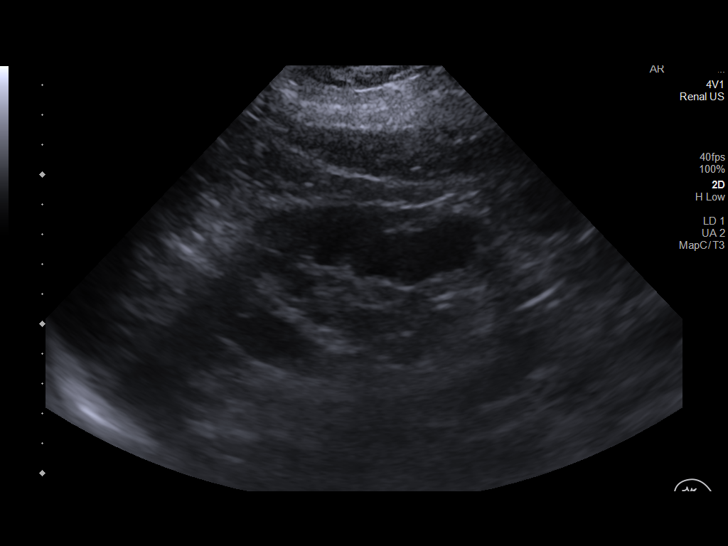
[im 32/51]
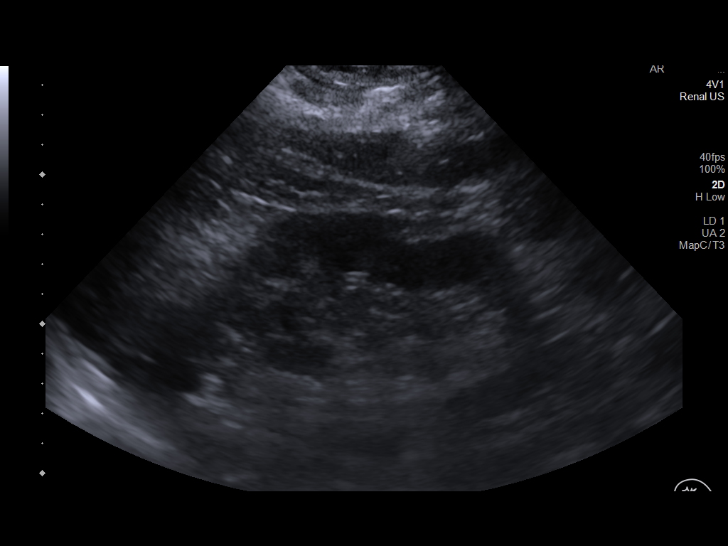
[im 34/51]
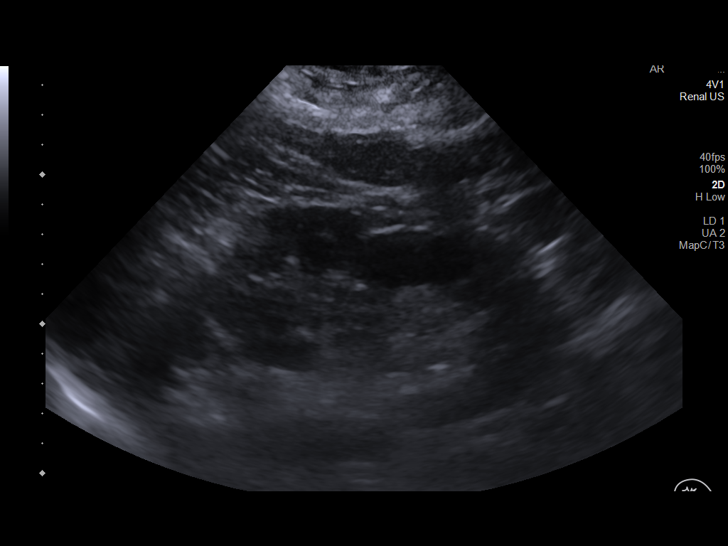
[im 38/51]
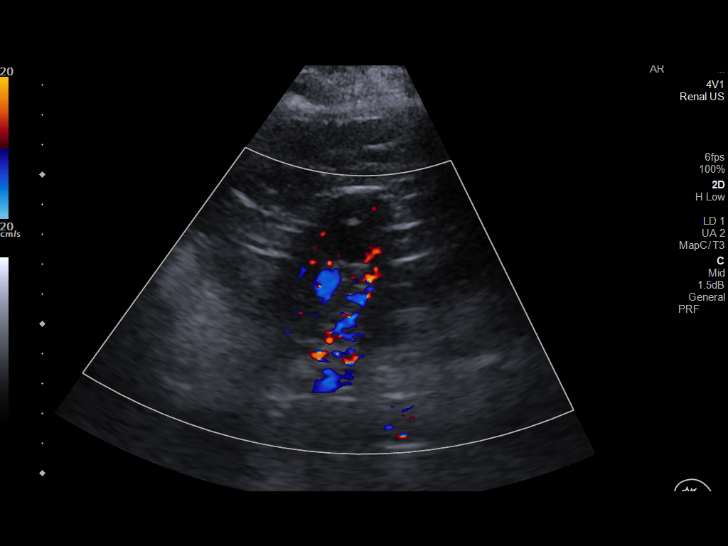
[im 42/51]
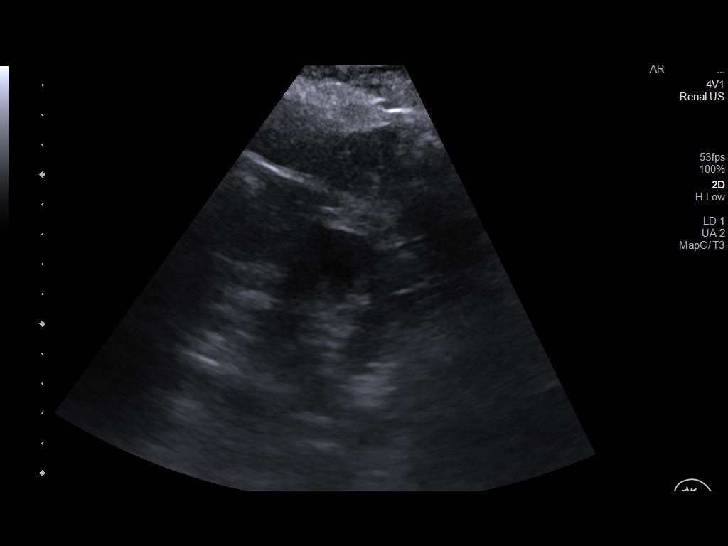
[im 46/51]
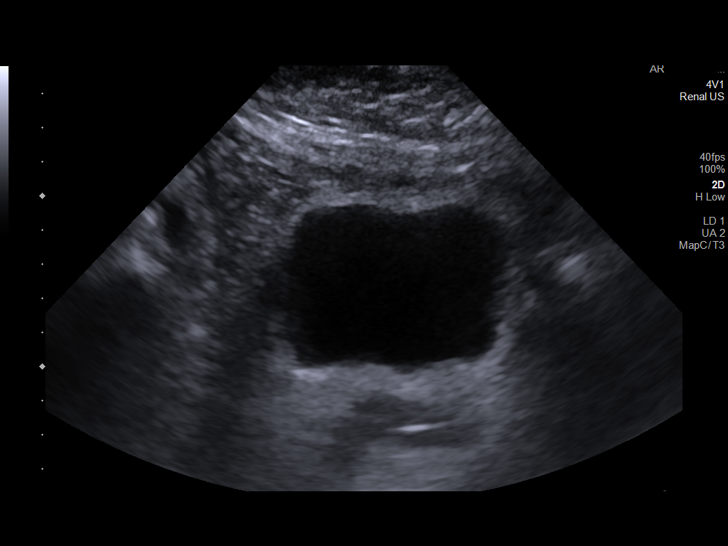
[im 51/51]
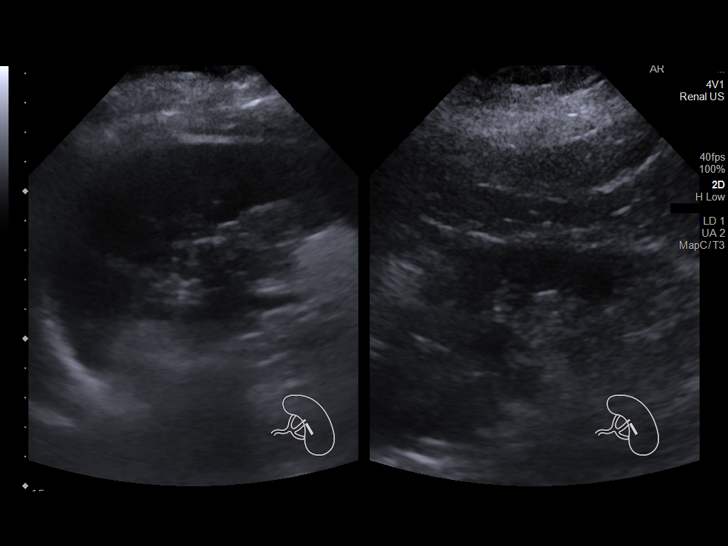

[14 of 25 positions shown; findings below may reference images not displayed]

FINDINGS: Right Kidney:

Renal measurements: 11.3 x 5.7 x 5.0 cm = volume: 167 mL.
Echogenicity within normal limits. No mass or hydronephrosis
visualized.

Left Kidney:

Renal measurements: 11.5 x 6.7 x 5.1 cm = volume: 204 mL.
Echogenicity within normal limits. No mass or hydronephrosis
visualized.

Bladder:

Appears normal for degree of bladder distention.

Other:

None.
IMPRESSION: Normal renal ultrasound

## 2023-10-14 ENCOUNTER — Other Ambulatory Visit: Payer: Self-pay | Admitting: Internal Medicine

## 2023-10-14 NOTE — Telephone Encounter (Signed)
Please call and schedule the pt for an ov before she can have anymore refills.

## 2023-10-15 NOTE — Telephone Encounter (Signed)
Pt stated that she will just find something over the counter for now.
